# Patient Record
Sex: Female | Born: 1995 | Race: Black or African American | Hispanic: No | Marital: Single | Smoking: Never smoker
Health system: Southern US, Community
[De-identification: ages and names within clinical notes are randomized; demographics above are authoritative.]

## PROBLEM LIST (undated history)

## (undated) DIAGNOSIS — B54 Unspecified malaria: Secondary | ICD-10-CM

## (undated) DIAGNOSIS — R51 Headache: Secondary | ICD-10-CM

## (undated) DIAGNOSIS — G43909 Migraine, unspecified, not intractable, without status migrainosus: Secondary | ICD-10-CM

## (undated) DIAGNOSIS — R519 Headache, unspecified: Secondary | ICD-10-CM

## (undated) HISTORY — DX: Migraine, unspecified, not intractable, without status migrainosus: G43.909

## (undated) HISTORY — DX: Headache, unspecified: R51.9

## (undated) HISTORY — DX: Unspecified malaria: B54

## (undated) HISTORY — DX: Headache: R51

## (undated) HISTORY — PX: WISDOM TOOTH EXTRACTION: SHX21

---
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
## 2018-05-18 ENCOUNTER — Other Ambulatory Visit: Payer: Self-pay

## 2018-05-18 ENCOUNTER — Encounter (HOSPITAL_COMMUNITY): Payer: Self-pay

## 2018-05-18 ENCOUNTER — Ambulatory Visit (HOSPITAL_COMMUNITY)
Admission: EM | Admit: 2018-05-18 | Discharge: 2018-05-18 | Disposition: A | Discharge status: Home / Self Care | Payer: PRIVATE HEALTH INSURANCE | Attending: Family Medicine | Admitting: Family Medicine

## 2018-05-18 DIAGNOSIS — R51 Headache: Secondary | ICD-10-CM

## 2018-05-18 DIAGNOSIS — R519 Headache, unspecified: Secondary | ICD-10-CM

## 2018-05-18 MED ORDER — KETOROLAC TROMETHAMINE 60 MG/2ML IM SOLN
60.0000 mg | Freq: Once | INTRAMUSCULAR | Status: AC
  Administered 2018-05-18: 60 mg via INTRAMUSCULAR

## 2018-05-18 MED ORDER — DEXAMETHASONE SODIUM PHOSPHATE 10 MG/ML IJ SOLN
INTRAMUSCULAR | Status: AC
  Filled 2018-05-18: qty 1

## 2018-05-18 MED ORDER — NAPROXEN 500 MG PO TABS: 500.0000 mg | ORAL_TABLET | Freq: Two times a day (BID) | ORAL | 0 refills | Status: DC

## 2018-05-18 MED ORDER — KETOROLAC TROMETHAMINE 60 MG/2ML IM SOLN
INTRAMUSCULAR | Status: AC
  Filled 2018-05-18: qty 2

## 2018-05-18 MED ORDER — DEXAMETHASONE SODIUM PHOSPHATE 10 MG/ML IJ SOLN
10.0000 mg | Freq: Once | INTRAMUSCULAR | Status: AC
  Administered 2018-05-18: 16:00:00 10 mg via INTRAMUSCULAR

## 2018-05-18 NOTE — ED Provider Notes (Signed)
[facility]   [id number] 05/18/18 Arrival Time: 1459  [id number]  SUBJECTIVE:  Anne Romero is a 23 y.o. female who complains of HA x 6 days.  Denies a precipitating event, or recent head trauma.  Patient localizes her pain to the back of her head.  Describes the pain as constant.  Patient has tried multiple OTC medications without relief. Reports photosensitivity.  Reports similar symptoms in the past that improved with medications.  This is not the worst headache of her life.  Does admit to mild dizziness associated with HA, that is improving.  Patient denies fever, chills, nausea, vomiting, aura, rhinorrhea, watery eyes, chest pain, SOB, abdominal pain, weakness, numbness or tingling.    ROS: As per HPI.  History reviewed. No pertinent past medical history. History reviewed. No pertinent surgical history. No Known Allergies No current facility-administered medications on file prior to encounter.    No current outpatient medications on file prior to encounter.   Social History   Socioeconomic History  . Marital status: Single    Spouse name: Not on file  . Number of children: Not on file  . Years of education: Not on file  . Highest education level: Not on file  Occupational History  . Not on file  Social Needs  . Financial resource strain: Not on file  . Food insecurity:    Worry: Not on file    Inability: Not on file  . Transportation needs:    Medical: Not on file    Non-medical: Not on file  Tobacco Use  . Smoking status: Never Smoker  Substance and Sexual Activity  . Alcohol use: Not on file  . Drug use: Not on file  . Sexual activity: Not on file  Lifestyle  . Physical activity:    Days per week: Not on file    Minutes per session: Not on file  . Stress: Not on file  Relationships  . Social connections:    Talks on phone: Not on file    Gets together: Not on file    Attends religious service: Not on file    Active member of club or  organization: Not on file    Attends meetings of clubs or organizations: Not on file    Relationship status: Not on file  . Intimate partner violence:    Fear of current or ex partner: Not on file    Emotionally abused: Not on file    Physically abused: Not on file    Forced sexual activity: Not on file  Other Topics Concern  . Not on file  Social History Narrative  . Not on file   Family History  Family history unknown: Yes    OBJECTIVE:  Vitals:   05/18/18 1517  BP: 119/74  Pulse: 94  Resp: 17  Temp: 98.1 F (36.7 C)  TempSrc: Oral  SpO2: 97%    General appearance: alert; no distress Eyes: PERRLA; EOMI HENT: normocephalic; atraumatic; mildly TTP over occipital lobe Neck: supple with FROM Lungs: clear to auscultation bilaterally Heart: regular rate and rhythm.  Radial pulses 2+ symmetrical bilaterally Extremities: no edema; symmetrical with no gross deformities Skin: warm and dry Neurologic: CN 2-12 grossly intact; finger to nose without difficulty; normal gait; strength and sensation intact bilaterally about the upper and lower extremities; negative pronator drift Psychological: alert and cooperative; normal mood and affect  ASSESSMENT & PLAN:  1. Acute nonintractable headache, unspecified headache type     Meds ordered this encounter  Medications  . ketorolac (TORADOL) injection 60 mg  . dexamethasone (DECADRON) injection 10 mg  . naproxen (NAPROSYN) 500 MG tablet    Sig: Take 1 tablet (500 mg total) by mouth 2 (two) times daily.    Dispense:  30 tablet    Refill:  0    Order Specific Question:   Supervising Provider    Answer:   Eustace MooreELSON, YVONNE SUE [[id number]]   Toradol shot given in office Decadron shot given in office Rest and drink plenty of fluids Naproxen prescription attached.  Fill as needed if symptoms persists.  DO NOT TAKE with other antiinflammatories as this may cause GI upset or bleed Follow up with PCP if symptoms persists Return or go to  the ER if you have any new or worsening symptoms such as fever, chills, nausea, vomiting, chest pain, shortness of breath, abdominal pain, weakness, numbness or tingling, etc...   Reviewed expectations re: course of current medical issues. Questions answered. Outlined signs and symptoms indicating need for more acute intervention. Patient verbalized understanding. After Visit Summary given.   Rennis HardingWurst, Teona Vargus, PA-C 05/18/18 1615

## 2018-05-18 NOTE — ED Triage Notes (Signed)
Pt presents with headache and dizziness for about a week.

## 2018-05-18 NOTE — Discharge Instructions (Signed)
Toradol shot given in office Decadron shot given in office Rest and drink plenty of fluids Naproxen prescription attached.  Fill as needed if symptoms persists.  DO NOT TAKE with other antiinflammatories as this may cause GI upset or bleed Follow up with PCP if symptoms persists Return or go to the ER if you have any new or worsening symptoms such as fever, chills, nausea, vomiting, chest pain, shortness of breath, abdominal pain, weakness, numbness or tingling, etc..Marland Kitchen

## 2018-05-20 ENCOUNTER — Emergency Department (HOSPITAL_COMMUNITY)
Admission: EM | Admit: 2018-05-20 | Discharge: 2018-05-20 | Disposition: A | Discharge status: Home / Self Care | Payer: 59 | Attending: Emergency Medicine | Admitting: Emergency Medicine

## 2018-05-20 ENCOUNTER — Other Ambulatory Visit: Payer: Self-pay

## 2018-05-20 ENCOUNTER — Encounter (HOSPITAL_COMMUNITY): Payer: Self-pay

## 2018-05-20 DIAGNOSIS — R51 Headache: Secondary | ICD-10-CM | POA: Insufficient documentation

## 2018-05-20 DIAGNOSIS — R519 Headache, unspecified: Secondary | ICD-10-CM

## 2018-05-20 MED ORDER — PROCHLORPERAZINE EDISYLATE 10 MG/2ML IJ SOLN
10.0000 mg | Freq: Once | INTRAMUSCULAR | Status: AC
  Administered 2018-05-20: 14:00:00 10 mg via INTRAMUSCULAR
  Filled 2018-05-20: qty 2

## 2018-05-20 MED ORDER — DIPHENHYDRAMINE HCL 50 MG/ML IJ SOLN
25.0000 mg | Freq: Once | INTRAMUSCULAR | Status: AC
  Administered 2018-05-20: 25 mg via INTRAMUSCULAR
  Filled 2018-05-20: qty 1

## 2018-05-20 MED ORDER — DEXAMETHASONE 4 MG PO TABS
10.0000 mg | ORAL_TABLET | Freq: Once | ORAL | Status: AC
  Administered 2018-05-20: 10 mg via ORAL
  Filled 2018-05-20: qty 3

## 2018-05-20 NOTE — Discharge Instructions (Signed)
Return for fever, worsening pain, one sided weakness or numbness, difficulty with speech or swallowing

## 2018-05-20 NOTE — ED Triage Notes (Signed)
Pt reports she has been experiencing frequent headaches X1 week. Pt ambulatory. Seen earlier this week at [facility] for same.

## 2018-05-20 NOTE — ED Provider Notes (Signed)
[facility]   CSN: [id number] Arrival date & time: 05/20/18  1322    History   Chief Complaint Chief Complaint  Patient presents with  . Headache    HPI Anne Romero is a 23 y.o. female.     23 yo F with a chief complaint of an occipital headache.  Been going on for about 3 or 4 days. This is much better after she went to urgent care and got 2 injections but she feels a sensation like she is having leakage of fluid from the back of her head into the right side of her neck.  She also feels her back of her head has a funny smell to it.  This is made her have trouble sleeping.  Typically wakes up and can smell sent again.  Denies any intraoral pain denies ear pain denies difficulty with speech or swallowing denies unilateral numbness or weakness denies neck pain.  The history is provided by the patient.  Headache  Pain location:  Occipital Quality:  Dull Radiates to:  Does not radiate Severity currently:  0/10 Severity at highest:  0/10 Onset quality:  Gradual Timing:  Constant Progression:  Worsening Chronicity:  New Similar to prior headaches: no   Relieved by:  Nothing Worsened by:  Nothing Ineffective treatments:  None tried Associated symptoms: no abdominal pain, no congestion, no dizziness, no fever, no myalgias, no nausea and no vomiting     History reviewed. No pertinent past medical history.  There are no active problems to display for this patient.   History reviewed. No pertinent surgical history.   OB History   No obstetric history on file.      Home Medications    Prior to Admission medications   Medication Sig Start Date End Date Taking? Authorizing Provider  naproxen (NAPROSYN) 500 MG tablet Take 1 tablet (500 mg total) by mouth 2 (two) times daily. 05/18/18   Rennis HardingWurst, Brittany, PA-C    Family History Family History  Family history unknown: Yes    Social History Social History    Tobacco Use  . Smoking status: Never Smoker  . Smokeless tobacco: Never Used  Substance Use Topics  . Alcohol use: Not on file  . Drug use: Not on file     Allergies   Patient has no known allergies.   Review of Systems Review of Systems  Constitutional: Negative for chills and fever.  HENT: Negative for congestion and rhinorrhea.   Eyes: Negative for redness and visual disturbance.  Respiratory: Negative for shortness of breath and wheezing.   Cardiovascular: Negative for chest pain and palpitations.  Gastrointestinal: Negative for abdominal pain, nausea and vomiting.  Genitourinary: Negative for dysuria and urgency.  Musculoskeletal: Negative for arthralgias and myalgias.  Skin: Negative for pallor and wound.  Neurological: Positive for headaches. Negative for dizziness.     Physical Exam Updated Vital Signs BP 130/80 (BP Location: Right Arm)   Pulse 87   Temp 97.8 F (36.6 C) (Oral)   Resp 16   LMP 05/10/2018   SpO2 99%   Physical Exam Vitals signs and nursing note reviewed.  Constitutional:      General: She is not in acute distress.    Appearance: She is well-developed. She is not diaphoretic.  HENT:     Head: Normocephalic and atraumatic.     Comments: Patient has multiple braids.  There is no appreciable erythema drainage or fluctuance to the occiput.  Palpated without  any significant tenderness.  Right mastoid process is no erythema or tenderness.  Right TM is normal.  No appreciable intraoral pathology.  Posterior molars are percussed without pain.  She has no sublingual swelling she is able to tolerate secretions without difficulty. Eyes:     Pupils: Pupils are equal, round, and reactive to light.  Neck:     Musculoskeletal: Normal range of motion and neck supple.  Cardiovascular:     Rate and Rhythm: Normal rate and regular rhythm.     Heart sounds: No murmur. No friction rub. No gallop.   Pulmonary:     Effort: Pulmonary effort is normal.     Breath  sounds: No wheezing or rales.  Abdominal:     General: There is no distension.     Palpations: Abdomen is soft.     Tenderness: There is no abdominal tenderness.  Musculoskeletal:        General: No tenderness.  Skin:    General: Skin is warm and dry.  Neurological:     Mental Status: She is alert and oriented to person, place, and time.     Cranial Nerves: Cranial nerves are intact.     Motor: Motor function is intact.     Coordination: Coordination normal.     Gait: Gait is intact.  Psychiatric:        Behavior: Behavior normal.      ED Treatments / Results  Labs (all labs ordered are listed, but only abnormal results are displayed) Labs Reviewed - No data to display  EKG None  Radiology No results found.  Procedures Procedures (including critical care time)  Medications Ordered in ED Medications  prochlorperazine (COMPAZINE) injection 10 mg (has no administration in time range)  diphenhydrAMINE (BENADRYL) injection 25 mg (has no administration in time range)  dexamethasone (DECADRON) tablet 10 mg (has no administration in time range)     Initial Impression / Assessment and Plan / ED Course  I have reviewed the triage vital signs and the nursing notes.  Pertinent labs & imaging results that were available during my care of the patient were reviewed by me and considered in my medical decision making (see chart for details).        23 yo F with a chief complaint of a feeling that something is draining on the right side of her head and that it is foul-smelling.  Based on that history expected to find an abscess with active drainage but I was unable to identify something on physical exam.  Right TM is normal.  Her intraoral exam is normal.  She has no obvious neurologic deficits.  This could be an atypical migraine with a sensory component.  We will give her a headache cocktail have her follow-up with the neurologist in the office.  1:53 PM:  I have discussed the  diagnosis/risks/treatment options with the patient and believe the pt to be eligible for discharge home to follow-up with PCP, neuro. We also discussed returning to the ED immediately if new or worsening sx occur. We discussed the sx which are most concerning (e.g., sudden worsening pain, fever, inability to tolerate by mouth) that necessitate immediate return. Medications administered to the patient during their visit and any new prescriptions provided to the patient are listed below.  Medications given during this visit Medications  prochlorperazine (COMPAZINE) injection 10 mg (has no administration in time range)  diphenhydrAMINE (BENADRYL) injection 25 mg (has no administration in time range)  dexamethasone (DECADRON) tablet 10  mg (has no administration in time range)     The patient appears reasonably screen and/or stabilized for discharge and I doubt any other medical condition or other [facility] requiring further screening, evaluation, or treatment in the ED at this time prior to discharge.    Final Clinical Impressions(s) / ED Diagnoses   Final diagnoses:  Occipital headache    ED Discharge Orders         Ordered    Ambulatory referral to Neurology    Comments:  New headache syndrome?   05/20/18 1348           Melene Plan, DO 05/20/18 1353

## 2018-05-20 NOTE — ED Notes (Signed)
Patient verbalizes understanding of discharge instructions. Opportunity for questioning and answers were provided. Armband removed by staff, pt discharged from ED.  

## 2018-05-30 NOTE — Progress Notes (Addendum)
Virtual Visit via Video Note The purpose of this virtual visit is to provide medical care while limiting exposure to the novel coronavirus.    Consent was obtained for video visit:  Yes.   Answered questions that patient had about telehealth interaction:  Yes.   I discussed the limitations, risks, security and privacy concerns of performing an evaluation and management service by telemedicine. I also discussed with the patient that there may be a patient responsible charge related to this service. The patient expressed understanding and agreed to proceed.  Pt location: Home Physician Location: office Name of referring provider:  Melene Plan, DO I connected with Anne Romero at patients initiation/request on 05/31/2018 at 12:50 PM EDT by video enabled telemedicine application and verified that I am speaking with the correct person using two identifiers. Pt MRN:  [id number] Pt DOB:  [birth year] Video Participants:  Anne Romero   History of Present Illness:  Anne Romero is a 23 year old woman who presents for headache.  History supplemented by Urgent Care and ED notes.   She has a history of headaches since teenage years.  Occasionally she would wake up with a mild headache which would last a few minutes after taking Aleve.  They occur about 3 times a month.     She presented to [facility] on 05/18/18 for 6 days of headache.  This headache was different.  She woke up with a headache and it had been persistent.  No preceding head trauma, fever, illness or other precipitating factor.  She described it as an 8/10 posterior pressure-like pain.  It was not the worst headache of her life and reports similar headaches in the past.  It was associated with some blurred vision and lightheadedness (particularly when she would stand up).  With lightheadedness, she experienced some nausea but no vomiting.  It was not associated with photophobia, phonophobia or unilateral  numbness or weakness.  They would not respond to Aleve.  She was treated Toradol and Decadron injections.  She returned [facility] ED two days later because headache persisted and she had a sensation of fluid leaking from the back of her head and down the right side of her neck.  She also endorsed lack of smell (such as inability to small perfume) and reported smelling gasoline that wasn't there.  Physical exam did not reveal any tenderness, erythema or abscess involving the suboccipital region, mastoid region or TM.  She was again treated with another headache cocktail of Compazine, Benadryl and Decadron injections and discharged with neurology follow up. Headache resolved about a week ago.  She still reports decreased sense of smell.  At night, she sometimes still can small gasoline.  Past Medical History: No past medical history.  Medications: Outpatient Encounter Medications as of 05/31/2018  Medication Sig  . naproxen (NAPROSYN) 500 MG tablet Take 1 tablet (500 mg total) by mouth 2 (two) times daily.   No facility-administered encounter medications on file as of 05/31/2018.     Allergies: No Known Allergies  Family History: Family History  Family history unknown: Yes    Social History: Social History   Socioeconomic History  . Marital status: Single    Spouse name: Not on file  . Number of children: Not on file  . Years of education: Not on file  . Highest education level: Not on file  Occupational History  . Not on file  Social Needs  . Financial resource strain: Not on file  .  Food insecurity:    Worry: Not on file    Inability: Not on file  . Transportation needs:    Medical: Not on file    Non-medical: Not on file  Tobacco Use  . Smoking status: Never Smoker  . Smokeless tobacco: Never Used  Substance and Sexual Activity  . Alcohol use: Not on file  . Drug use: Not on file  . Sexual activity: Not on file  Lifestyle  . Physical activity:    Days per week:  Not on file    Minutes per session: Not on file  . Stress: Not on file  Relationships  . Social connections:    Talks on phone: Not on file    Gets together: Not on file    Attends religious service: Not on file    Active member of club or organization: Not on file    Attends meetings of clubs or organizations: Not on file    Relationship status: Not on file  . Intimate partner violence:    Fear of current or ex partner: Not on file    Emotionally abused: Not on file    Physically abused: Not on file    Forced sexual activity: Not on file  Other Topics Concern  . Not on file  Social History Narrative  . Not on file    Review Of Systems: Review of Systems  Constitutional: Negative for chills, fever and malaise/fatigue.  HENT: Negative for congestion, ear discharge, ear pain, hearing loss, sore throat and tinnitus.   Eyes: Negative for blurred vision, double vision, photophobia, pain, discharge and redness.  Respiratory: Negative for cough, sputum production, shortness of breath and wheezing.   Cardiovascular: Negative for chest pain, palpitations and orthopnea.  Gastrointestinal: Negative for abdominal pain, constipation, diarrhea, nausea and vomiting.  Genitourinary: Negative for dysuria.  Musculoskeletal: Negative for myalgias.  Skin: Negative for itching and rash.  Neurological: Negative for tremors, sensory change, speech change and focal weakness.  Endo/Heme/Allergies: Does not bruise/bleed easily.  Psychiatric/Behavioral: Negative for depression. The patient is not nervous/anxious.    Observations/Objective:   Height 5\' 5"  (1.651 m), weight 198 lb (89.8 kg), last menstrual period 05/10/2018. Alert and oriented.  Speech fluent and not dysarthric.  Language intact.  Eyes orthophoric and move in all directions.  Facial sensation intact.  Face symmetric.  Tongue midline.  Pronator drift absent.  Finger-to-nose testing normal.  Romberg negative. Assessment and Plan:   1.  New  daily persistent headache 2.  Anosmia 3.  Phantosmia  Headache may have been related to seasonal allergies, especially since it has improved.  Anosmia may be related to allergies however she reports no congestion or upper respiratory symptoms.  It may also be a symptom of COVID-19, however this is unlikely given that she has not had fever, cough, shortness of breath.  It would also not account for the phantosmia.  Unclear of etiology of phantosmia.  May be feature of migraine.  Unlikely to be epileptic activity.  Given that this was a new intractable headache and she continues to experience anosmia and phantosmia, imaging of the brain is warranted.  1.  MRI of brain with and without contrast.  Further recommendations pending results.  Follow Up Instructions:    -I discussed the assessment and treatment plan with the patient. The patient was provided an opportunity to ask questions and all were answered. The patient agreed with the plan and demonstrated an understanding of the instructions.   The patient was  advised to call back or seek an in-person evaluation if the symptoms worsen or if the condition fails to improve as anticipated.  Dudley Major, DO

## 2018-05-31 ENCOUNTER — Encounter: Payer: Self-pay | Admitting: Neurology

## 2018-05-31 ENCOUNTER — Other Ambulatory Visit: Payer: Self-pay

## 2018-05-31 ENCOUNTER — Telehealth (INDEPENDENT_AMBULATORY_CARE_PROVIDER_SITE_OTHER): Payer: PRIVATE HEALTH INSURANCE | Admitting: Neurology

## 2018-05-31 VITALS — Ht 65.0 in | Wt 198.0 lb

## 2018-05-31 DIAGNOSIS — G4452 New daily persistent headache (NDPH): Secondary | ICD-10-CM | POA: Diagnosis not present

## 2018-05-31 DIAGNOSIS — R442 Other hallucinations: Secondary | ICD-10-CM

## 2018-05-31 DIAGNOSIS — R430 Anosmia: Secondary | ICD-10-CM

## 2018-06-01 NOTE — Addendum Note (Signed)
Addended by: Dorthy Cooler on: 06/01/2018 09:35 AM   Modules accepted: Orders

## 2018-06-15 ENCOUNTER — Ambulatory Visit
Admission: RE | Admit: 2018-06-15 | Discharge: 2018-06-15 | Disposition: A | Discharge status: Home / Self Care | Payer: PRIVATE HEALTH INSURANCE | Source: Ambulatory Visit | Attending: Neurology | Admitting: Neurology

## 2018-06-15 ENCOUNTER — Other Ambulatory Visit: Payer: Self-pay

## 2018-06-15 DIAGNOSIS — G4452 New daily persistent headache (NDPH): Secondary | ICD-10-CM

## 2018-06-15 DIAGNOSIS — R430 Anosmia: Secondary | ICD-10-CM

## 2018-06-15 DIAGNOSIS — R442 Other hallucinations: Secondary | ICD-10-CM

## 2018-06-15 MED ORDER — GADOBENATE DIMEGLUMINE 529 MG/ML IV SOLN
19.0000 mL | Freq: Once | INTRAVENOUS | Status: AC | PRN
  Administered 2018-06-15: 19 mL via INTRAVENOUS

## 2018-06-20 ENCOUNTER — Telehealth: Payer: Self-pay

## 2018-06-20 DIAGNOSIS — R442 Other hallucinations: Secondary | ICD-10-CM

## 2018-06-20 NOTE — Telephone Encounter (Signed)
-----   Message from Drema Dallas, DO sent at 06/15/2018 10:21 AM EDT ----- MRI of brain is normal.  I would like to order EEG for phantosmia

## 2018-06-20 NOTE — Telephone Encounter (Signed)
Called Pt, LMOVM for her to rtrn call re: MRI results

## 2018-06-23 NOTE — Addendum Note (Signed)
Addended by: Dorthy Cooler on: 06/23/2018 03:56 PM   Modules accepted: Orders

## 2018-06-23 NOTE — Telephone Encounter (Signed)
Called and advised Pt of MRI results and EEG recommendation. She is aware she will receive a phone call to schedule the EEG.

## 2018-07-06 ENCOUNTER — Other Ambulatory Visit: Payer: 59

## 2018-07-11 ENCOUNTER — Ambulatory Visit (INDEPENDENT_AMBULATORY_CARE_PROVIDER_SITE_OTHER): Payer: 59 | Admitting: Neurology

## 2018-07-11 ENCOUNTER — Other Ambulatory Visit: Payer: Self-pay

## 2018-07-11 DIAGNOSIS — R442 Other hallucinations: Secondary | ICD-10-CM | POA: Diagnosis not present

## 2018-07-14 ENCOUNTER — Telehealth: Payer: Self-pay

## 2018-07-14 NOTE — Telephone Encounter (Signed)
Called and advised Pt of EEG results 

## 2018-07-14 NOTE — Telephone Encounter (Signed)
-----   Message from Pieter Partridge, DO sent at 07/14/2018 12:05 PM EDT ----- Please let patient know that EEG is normal

## 2018-07-15 NOTE — Procedures (Signed)
ELECTROENCEPHALOGRAM REPORT  Date of Study: 07/11/2018  Patient's Name: Anne Romero MRN: [id number] Date of Birth: 1995-11-23  Clinical History: phantosmia and migraines  Medications: naproxen  Technical Summary: A multichannel digital EEG recording measured by the international 10-20 system with electrodes applied with paste and impedances below 5000 ohms performed in our laboratory with EKG monitoring in an awake and asleep patient.  Hyperventilation not performed as patient wearing mask for COVID-19.  Photic stimulation was performed.  The digital EEG was referentially recorded, reformatted, and digitally filtered in a variety of bipolar and referential montages for optimal display.    Description: The patient is awake and asleep during the recording.  During maximal wakefulness, there is a symmetric, medium voltage 9 Hz posterior dominant rhythm that attenuates with eye opening.  The record is symmetric.  During drowsiness and sleep, there is an increase in theta slowing of the background.  Vertex waves and symmetric sleep spindles were seen.  Photic stimulation did not elicit any abnormalities.  There were no epileptiform discharges or electrographic seizures seen.    EKG lead was unremarkable.  Impression: This awake and asleep EEG is normal.    Clinical Correlation: A normal EEG does not exclude a clinical diagnosis of epilepsy.  If further clinical questions remain, prolonged EEG may be helpful.  Clinical correlation is advised.   Metta Clines, DO

## 2019-05-21 ENCOUNTER — Encounter (HOSPITAL_COMMUNITY): Payer: Self-pay

## 2019-05-21 ENCOUNTER — Other Ambulatory Visit: Payer: Self-pay

## 2019-05-21 ENCOUNTER — Ambulatory Visit (HOSPITAL_COMMUNITY)
Admission: EM | Admit: 2019-05-21 | Discharge: 2019-05-21 | Disposition: A | Discharge status: Home / Self Care | Payer: PRIVATE HEALTH INSURANCE | Attending: Family Medicine | Admitting: Family Medicine

## 2019-05-21 DIAGNOSIS — H65191 Other acute nonsuppurative otitis media, right ear: Secondary | ICD-10-CM

## 2019-05-21 DIAGNOSIS — H9201 Otalgia, right ear: Secondary | ICD-10-CM | POA: Diagnosis not present

## 2019-05-21 MED ORDER — SIMETHICONE 80 MG PO CHEW: 80.0000 mg | CHEWABLE_TABLET | Freq: Four times a day (QID) | ORAL | 0 refills | Status: DC | PRN

## 2019-05-21 NOTE — ED Triage Notes (Signed)
Pt c/o fb in right ear. Pt reports that while cleaning ears with cotton swab, the cotton got stuck in right ear.

## 2019-05-21 NOTE — Discharge Instructions (Addendum)
You may take Zyrtec to help dry up the fluid in your ears.   If you are not feeling better in the  next 2 days, follow up with our office or primary care as needed.

## 2019-05-21 NOTE — ED Provider Notes (Signed)
[place]    CSN: [id number] Arrival date & time: 05/21/19  1035      History   Chief Complaint Chief Complaint  Patient presents with  . Foreign Body in [place] is a 24 y.o. female.   Patient reports that she thinks she has a foreign body in her right ear.  Reports that she was cleaning her ears last night with Q-tips, and she pulled the Q-tip out of her ear and there was no cotton on the end.  Reports that she did not see it when she woke up in the bed this morning.  Describes fullness of her right ear and mild discomfort.  Denies headache, sore throat, cough, nausea, vomiting, diarrhea, rash, fever, other symptoms.  ROS per HPI  The history is provided by the patient.    Past Medical History:  Diagnosis Date  . Frequent headaches     There are no problems to display for this patient.   Past Surgical History:  Procedure Laterality Date  . WISDOM TOOTH EXTRACTION      OB History   No obstetric history on file.      Home Medications    Prior to Admission medications   Medication Sig Start Date End Date Taking? Authorizing Provider  naproxen (NAPROSYN) 500 MG tablet Take 1 tablet (500 mg total) by mouth 2 (two) times daily. 05/18/18   Wurst, [place], PA-C  simethicone (GAS-X) 80 MG chewable tablet Chew 1 tablet (80 mg total) by mouth every 6 (six) hours as needed for flatulence. 05/21/19   Faustino Congress, NP    Family History Family History  Problem Relation Age of Onset  . Hypertension Mother     Social History Social History   Tobacco Use  . Smoking status: Never Smoker  . Smokeless tobacco: Never Used  Substance Use Topics  . Alcohol use: Never  . Drug use: Never     Allergies   Patient has no known allergies.   Review of Systems Review of Systems   Physical Exam Triage Vital Signs ED Triage Vitals  Enc Vitals Group     BP 05/21/19 1128 140/82     Pulse Rate 05/21/19 1128 72     Resp  05/21/19 1128 18     Temp 05/21/19 1128 97.8 F (36.6 C)     Temp Source 05/21/19 1128 Oral     SpO2 05/21/19 1128 100 %     Weight --      Height --      Head Circumference --      Peak Flow --      Pain Score 05/21/19 1125 1     Pain Loc --      Pain Edu? --      Excl. in [place]? --    No data found.  Updated Vital Signs BP 140/82 (BP Location: Right Arm)   Pulse 72   Temp 97.8 F (36.6 C) (Oral)   Resp 18   LMP 05/06/2019   SpO2 100%   Visual Acuity Right Eye Distance:   Left Eye Distance:   Bilateral Distance:    Right Eye Near:   Left Eye Near:    Bilateral Near:     Physical Exam Vitals and nursing note reviewed.  Constitutional:      General: She is not in acute distress.    Appearance: Normal appearance. She is well-developed and normal weight. She is not ill-appearing.  HENT:  Head: Normocephalic and atraumatic.     Right Ear: A middle ear effusion is present.     Left Ear: Tympanic membrane normal.     Nose: Nose normal. No congestion.     Mouth/Throat:     Mouth: Mucous membranes are moist.     Pharynx: Oropharynx is clear. No posterior oropharyngeal erythema.  Eyes:     Extraocular Movements: Extraocular movements intact.     Conjunctiva/sclera: Conjunctivae normal.     Pupils: Pupils are equal, round, and reactive to light.  Cardiovascular:     Rate and Rhythm: Normal rate and regular rhythm.     Heart sounds: Normal heart sounds. No murmur.  Pulmonary:     Effort: Pulmonary effort is normal. No respiratory distress.     Breath sounds: Normal breath sounds.  Abdominal:     Palpations: Abdomen is soft.     Tenderness: There is no abdominal tenderness.  Musculoskeletal:        General: Normal range of motion.     Cervical back: Normal range of motion and neck supple. No rigidity.  Lymphadenopathy:     Cervical: No cervical adenopathy.  Skin:    General: Skin is warm and dry.  Neurological:     General: No focal deficit present.     Mental  Status: She is alert and oriented to person, place, and time.  Psychiatric:        Mood and Affect: Mood normal.        Behavior: Behavior normal.        Thought Content: Thought content normal.      UC Treatments / Results  Labs (all labs ordered are listed, but only abnormal results are displayed) Labs Reviewed - No data to display  EKG   Radiology No results found.  Procedures Procedures (including critical care time)  Medications Ordered in UC Medications - No data to display  Initial Impression / Assessment and Plan / UC Course  I have reviewed the triage vital signs and the nursing notes.  Pertinent labs & imaging results that were available during my care of the patient were reviewed by me and considered in my medical decision making (see chart for details).     Otalgia: Right otalgia since yesterday.  Right TM with middle ear effusion, no bulging no erythema.  Left TM pearly gray, no effusion.  No foreign body noted in either ear.  Patient instructed that she may take daily over-the-counter antihistamine to help dry up with fluid..  Patient to follow-up with primary care with this office as needed.  Patient verbalized understanding is in agreement with treatment plan. Final Clinical Impressions(s) / UC Diagnoses   Final diagnoses:  Otalgia of right ear     Discharge Instructions     You may take Zyrtec to help dry up the fluid in your ears.   If you are not feeling better in the  next 2 days, follow up with our office or primary care as needed.     ED Prescriptions    Medication Sig Dispense Auth. Provider   simethicone (GAS-X) 80 MG chewable tablet Chew 1 tablet (80 mg total) by mouth every 6 (six) hours as needed for flatulence. 30 tablet Moshe Cipro, NP     PDMP not reviewed this encounter.   Moshe Cipro, NP 05/22/19 2134

## 2020-12-28 IMAGING — MR MRI HEAD WITHOUT AND WITH CONTRAST
13 series · 45 of 48 positions shown · IV contrast (multihance)
Comparison: None.

CLINICAL DATA: Longstanding headaches. This is worse for the past
month. Unpleasant olfactory auras.

EXAM:
MRI HEAD WITHOUT AND WITH CONTRAST
TECHNIQUE: Multiplanar, multiecho pulse sequences of the brain and surrounding
structures were obtained without and with intravenous contrast.
CONTRAST:  19mL MULTIHANCE GADOBENATE DIMEGLUMINE 529 MG/ML IV SOLN

[Series 2: t1_se_sag · sagittal · 5.0mm · 0.45mm/px · 1 of 21 slices shown]
[im 1/21]
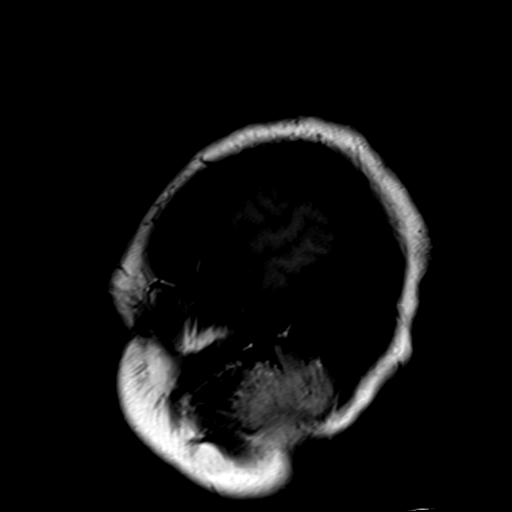

[Series 3: ep2d_diff_(id)_trace · axial · 3.0mm · 1.80mm/px · z∈[-51,+88]mm · 6 of 93 slices shown]
[im 1/93]
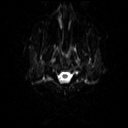
[im 19/93]
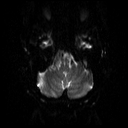
[im 37/93]
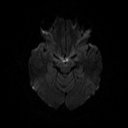
[im 56/93]
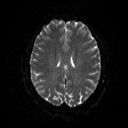
[im 74/93]
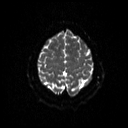
[im 93/93]
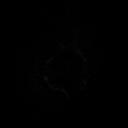

[Series 4: ep2d_diff_(id)_trace_adc · axial · 3.0mm · 1.80mm/px · z∈[-51,+88]mm · 3 of 48 slices shown]
[im 1/48]
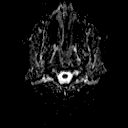
[im 24/48]
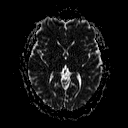
[im 48/48]
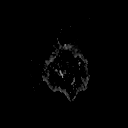

[Series 5: ep2d_diff_cor · coronal · 5.0mm · 1.77mm/px · 3 of 47 slices shown]
[im 1/47]
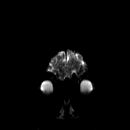
[im 24/47]
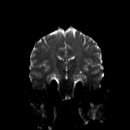
[im 47/47]
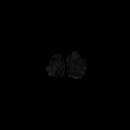

[Series 6: ep2d_diff_cor_adc · coronal · 5.0mm · 1.77mm/px · 2 of 24 slices shown]
[im 1/24]
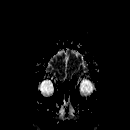
[im 24/24]
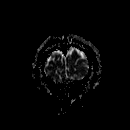

[Series 7: t2_tse_tra · axial · 5.0mm · 0.72mm/px · z∈[-66,+81]mm · 2 of 26 slices shown]
[im 1/26]
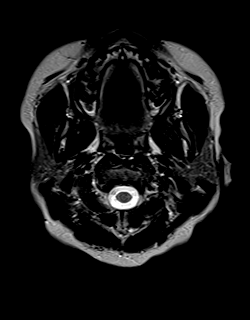
[im 26/26]
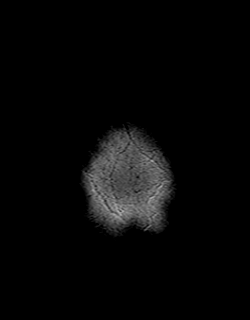

[Series 8: FLAIR · axial · 3.0mm · 0.43mm/px · z∈[-69,+84]mm · 2 of 27 slices shown]
[im 1/27]
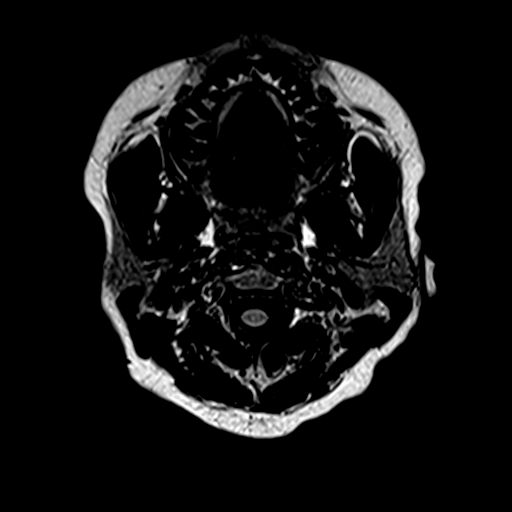
[im 27/27]
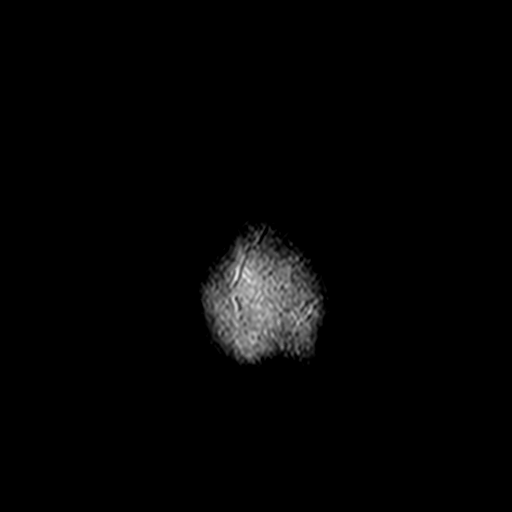

[Series 10: swi_images · axial · 4.0mm · 0.90mm/px · z∈[-61,+76]mm · 2 of 36 slices shown]
[im 1/36]
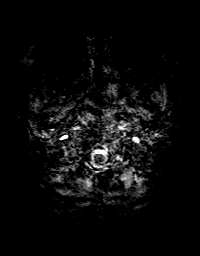
[im 36/36]
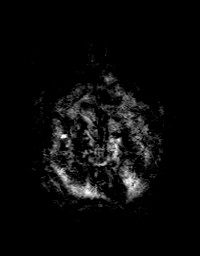

[Series 11: t1_mpr_tra · axial · 1.0mm · 0.72mm/px · z∈[-63,+77]mm · 8 of 144 slices shown]
[im 1/144]
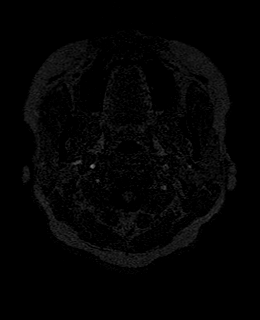
[im 18/144]
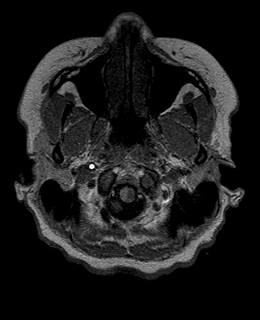
[im 36/144]
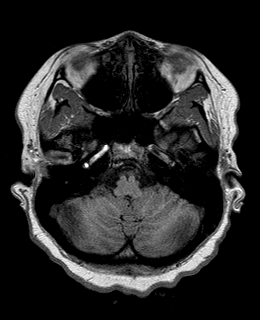
[im 54/144]
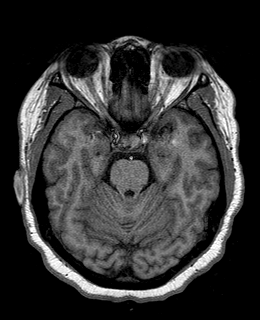
[im 90/144]
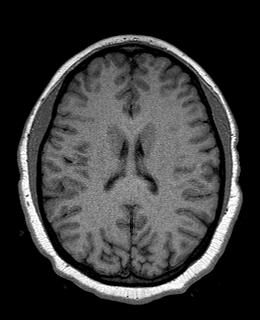
[im 108/144]
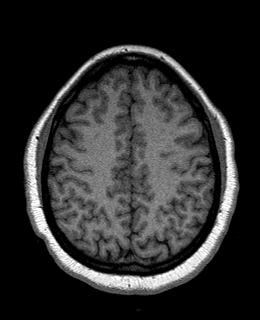
[im 126/144]
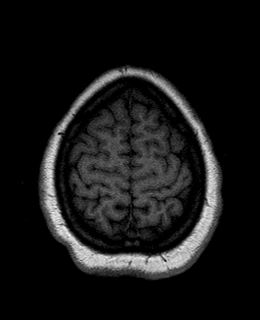
[im 144/144]
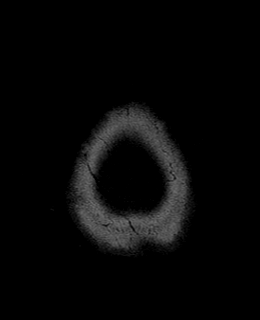

[Series 12: bSSFP · axial · 0.7mm · 0.28mm/px · z∈[-28,+21]mm · 5 of 72 slices shown]
[im 1/72]
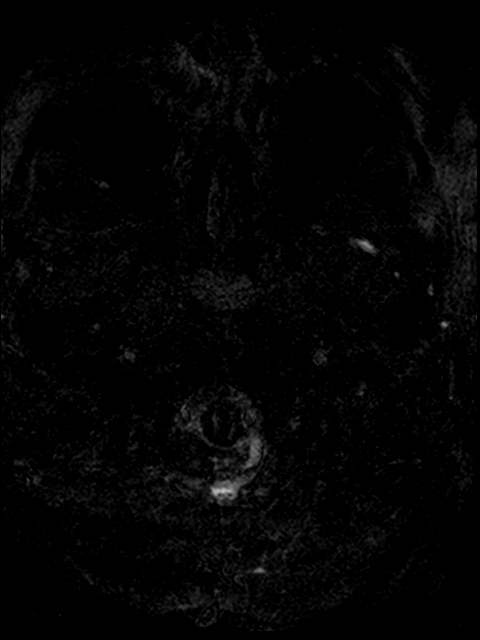
[im 18/72]
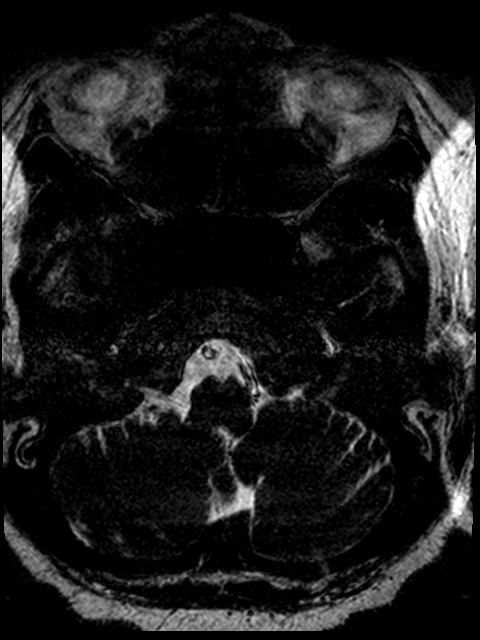
[im 36/72]
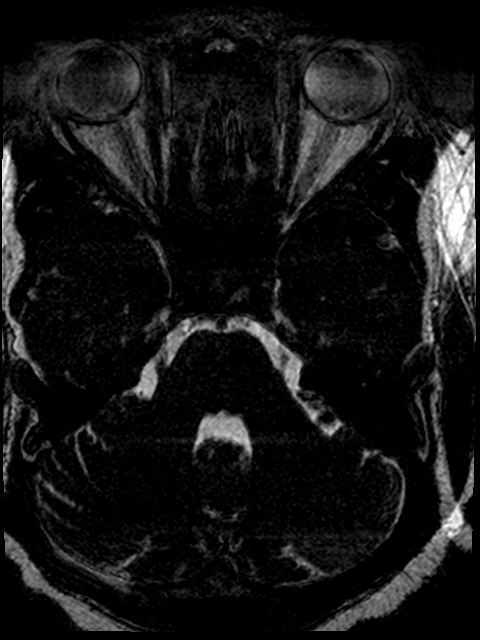
[im 54/72]
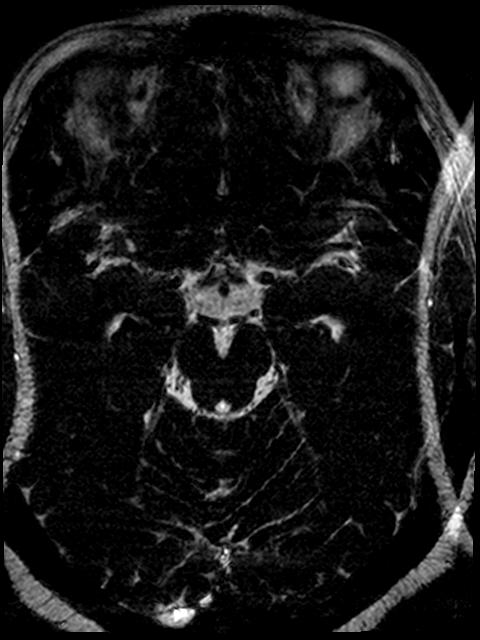
[im 72/72]
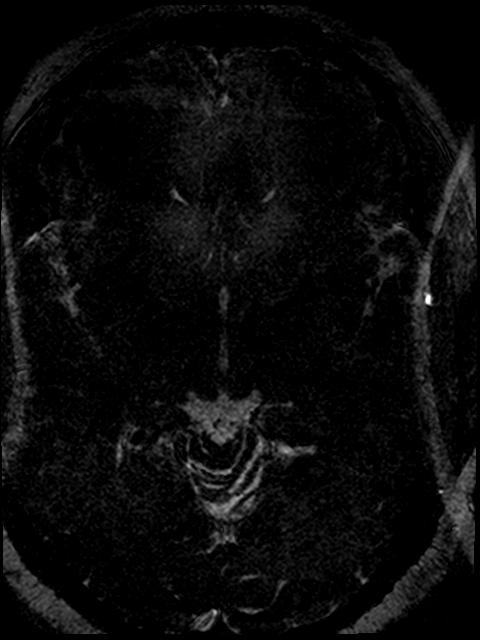

[Series 14: post t1_mpr_tra · axial · 1.0mm · 0.72mm/px · z∈[-63,+60]mm · 7 of 144 slices shown]
[im 1/144]
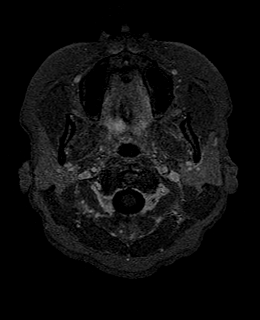
[im 18/144]
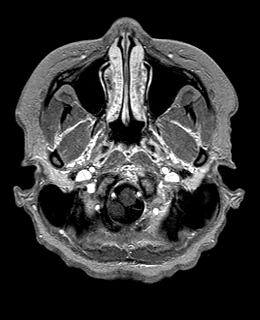
[im 36/144]
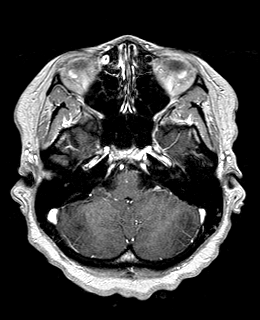
[im 54/144]
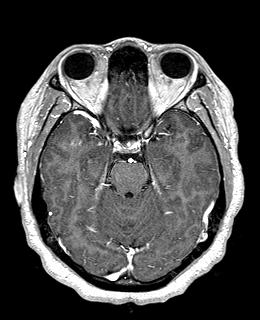
[im 90/144]
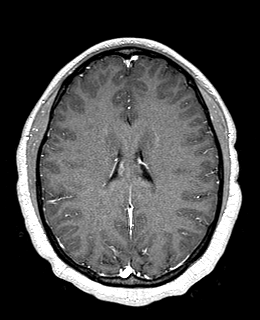
[im 108/144]
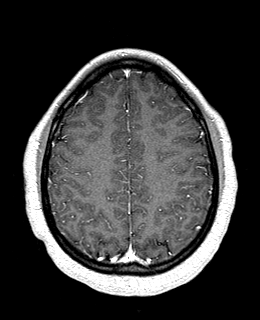
[im 126/144]
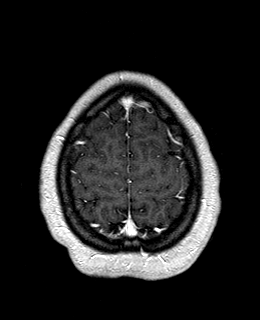

[Series 15: T1 post-contrast · coronal · 5.0mm · 0.72mm/px · 2 of 30 slices shown]
[im 1/30]
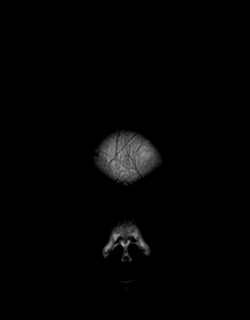
[im 30/30]
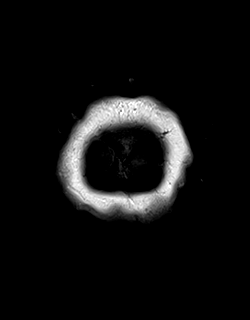

[Series 16: T2 · coronal · 5.0mm · 0.45mm/px · 2 of 26 slices shown]
[im 1/26]
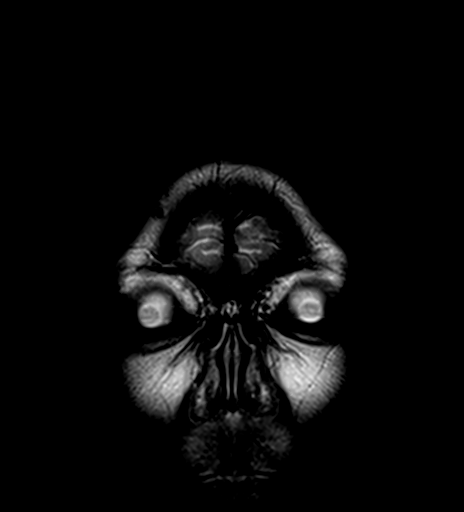
[im 26/26]
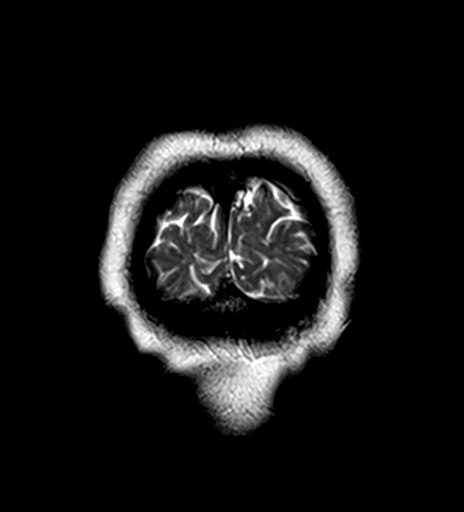

[45 of 48 positions shown; findings below may reference images not displayed]

FINDINGS: Brain: No evidence for acute infarction, hemorrhage, mass lesion,
hydrocephalus, or extra-axial fluid. Normal cerebral volume. No
white matter disease.

Post infusion, no abnormal enhancement of the brain or meninges.

Vascular: Flow voids are maintained throughout the carotid, basilar,
and vertebral arteries. There are no areas of chronic hemorrhage.

Skull and upper cervical spine: Unremarkable visualized calvarium,
skullbase, and cervical vertebrae. Pituitary, pineal, cerebellar
tonsils unremarkable. No upper cervical cord lesions.

Sinuses/Orbits: No orbital masses or proptosis. Globes appear
symmetric. Sinuses appear well aerated, without evidence for
air-fluid level.

Other: No nasopharyngeal pathology or mastoid fluid. Scalp and other
visualized extracranial soft tissues grossly unremarkable.
IMPRESSION: Negative exam.

## 2023-07-14 ENCOUNTER — Ambulatory Visit (HOSPITAL_COMMUNITY)
Admission: EM | Admit: 2023-07-14 | Discharge: 2023-07-14 | Disposition: A | Discharge status: Home / Self Care | Payer: No Typology Code available for payment source | Attending: Internal Medicine | Admitting: Internal Medicine

## 2023-07-14 ENCOUNTER — Encounter (HOSPITAL_COMMUNITY): Payer: Self-pay | Admitting: *Deleted

## 2023-07-14 ENCOUNTER — Other Ambulatory Visit: Payer: Self-pay

## 2023-07-14 DIAGNOSIS — T24231A Burn of second degree of right lower leg, initial encounter: Secondary | ICD-10-CM

## 2023-07-14 DIAGNOSIS — L03116 Cellulitis of left lower limb: Secondary | ICD-10-CM

## 2023-07-14 MED ORDER — IBUPROFEN 600 MG PO TABS: 600.0000 mg | ORAL_TABLET | Freq: Four times a day (QID) | ORAL | 0 refills | Status: DC | PRN

## 2023-07-14 MED ORDER — CLINDAMYCIN HCL 300 MG PO CAPS: 300.0000 mg | ORAL_CAPSULE | Freq: Three times a day (TID) | ORAL | 0 refills | Status: DC

## 2023-07-14 MED ORDER — ACETAMINOPHEN 500 MG PO TABS: 1000.0000 mg | ORAL_TABLET | Freq: Four times a day (QID) | ORAL | 0 refills | Status: DC | PRN

## 2023-07-14 MED ORDER — BACITRACIN ZINC 500 UNIT/GM EX OINT: TOPICAL_OINTMENT | Freq: Once | CUTANEOUS | Status: AC
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
## 2023-07-14 NOTE — Discharge Instructions (Signed)
 Your wound is very infected.  Take clindamycin antibiotic every 8 hours for the next 10 days.  Perform warm compresses to the wound and rinse the wound thoroughly every 12 hours (at least), then apply mupirocin ointment to the wound.  After applying mupirocin ointment, apply a Telfa dressing (nonstick gauze) and tape. Allow the wound to breathe and be open to air at nighttime.  Go to the ER if you develop fever, chills, nausea, vomiting, body aches, or new/worsening redness and swelling to the wound.  Please schedule a follow-up appointment with your primary care provider (Dr. Coccaro) listed below.   Schedule a follow-up appointment with [place] [facility], information is below.  Take Tylenol 1000 mg every 6 hours as needed for pain and ibuprofen 600 mg every 6 hours as needed for pain associated with wound.  If you develop any new or worsening symptoms or if your symptoms do not start to improve, please return here or follow-up with your primary care provider. If your symptoms are severe, please go to the emergency room.

## 2023-07-17 ENCOUNTER — Emergency Department (HOSPITAL_COMMUNITY)
Admission: EM | Admit: 2023-07-17 | Discharge: 2023-07-17 | Disposition: A | Discharge status: Home / Self Care | Attending: Emergency Medicine | Admitting: Emergency Medicine

## 2023-07-17 ENCOUNTER — Encounter (HOSPITAL_COMMUNITY): Payer: Self-pay | Admitting: *Deleted

## 2023-07-17 ENCOUNTER — Emergency Department (HOSPITAL_COMMUNITY)

## 2023-07-17 ENCOUNTER — Other Ambulatory Visit: Payer: Self-pay

## 2023-07-17 DIAGNOSIS — U071 COVID-19: Secondary | ICD-10-CM | POA: Diagnosis not present

## 2023-07-17 DIAGNOSIS — R509 Fever, unspecified: Secondary | ICD-10-CM | POA: Diagnosis present

## 2023-07-17 LAB — CBC WITH DIFFERENTIAL/PLATELET
Abs Immature Granulocytes: 0 10*3/uL (ref 0.00–0.07)
Basophils Absolute: 0 10*3/uL (ref 0.0–0.1)
Basophils Relative: 0 %
Eosinophils Absolute: 0 10*3/uL (ref 0.0–0.5)
Eosinophils Relative: 0 %
HCT: 39.4 % (ref 36.0–46.0)
Hemoglobin: 13.1 g/dL (ref 12.0–15.0)
Lymphocytes Relative: 21 %
Lymphs Abs: 1 10*3/uL (ref 0.7–4.0)
MCH: 30.5 pg (ref 26.0–34.0)
MCHC: 33.2 g/dL (ref 30.0–36.0)
MCV: 91.6 fL (ref 80.0–100.0)
Monocytes Absolute: 1 10*3/uL (ref 0.1–1.0)
Monocytes Relative: 20 %
Neutro Abs: 2.9 10*3/uL (ref 1.7–7.7)
Neutrophils Relative %: 59 %
Platelets: UNDETERMINED 10*3/uL (ref 150–400)
RBC: 4.3 MIL/uL (ref 3.87–5.11)
RDW: 12.7 % (ref 11.5–15.5)
WBC: 4.9 10*3/uL (ref 4.0–10.5)
nRBC: 0 % (ref 0.0–0.2)
nRBC: 0 /100{WBCs}

## 2023-07-17 LAB — COMPREHENSIVE METABOLIC PANEL WITH GFR
ALT: 12 U/L (ref 0–44)
AST: 18 U/L (ref 15–41)
Albumin: 3.1 g/dL — ABNORMAL LOW (ref 3.5–5.0)
Alkaline Phosphatase: 52 U/L (ref 38–126)
Anion gap: 8 (ref 5–15)
BUN: 5 mg/dL — ABNORMAL LOW (ref 6–20)
CO2: 23 mmol/L (ref 22–32)
Calcium: 8.8 mg/dL — ABNORMAL LOW (ref 8.9–10.3)
Chloride: 103 mmol/L (ref 98–111)
Creatinine, Ser: 0.89 mg/dL (ref 0.44–1.00)
GFR, Estimated: >60 mL/min (ref >60)
Glucose, Bld: 101 mg/dL — ABNORMAL HIGH (ref 70–99)
Potassium: 3.6 mmol/L (ref 3.5–5.1)
Sodium: 134 mmol/L — ABNORMAL LOW (ref 135–145)
Total Bilirubin: 1.1 mg/dL (ref 0.0–1.2)
Total Protein: 6.8 g/dL (ref 6.5–8.1)

## 2023-07-17 LAB — URINALYSIS, W/ REFLEX TO CULTURE (INFECTION SUSPECTED)
Bilirubin Urine: NEGATIVE
Glucose, UA: NEGATIVE mg/dL
Hgb urine dipstick: NEGATIVE
Ketones, ur: 5 mg/dL — AB
Leukocytes,Ua: NEGATIVE
Nitrite: NEGATIVE
Protein, ur: NEGATIVE mg/dL
Specific Gravity, Urine: 1.011 (ref 1.005–1.030)
pH: 6 (ref 5.0–8.0)

## 2023-07-17 LAB — PROTIME-INR
INR: 1.1 (ref 0.8–1.2)
Prothrombin Time: 14.6 s (ref 11.4–15.2)

## 2023-07-17 LAB — RESP PANEL BY RT-PCR (RSV, FLU A&B, COVID)  RVPGX2
Influenza A by PCR: NEGATIVE
Influenza B by PCR: NEGATIVE
Resp Syncytial Virus by PCR: NEGATIVE
SARS Coronavirus 2 by RT PCR: POSITIVE — AB

## 2023-07-17 LAB — I-STAT CG4 LACTIC ACID, ED: Lactic Acid, Venous: 1.2 mmol/L (ref 0.5–1.9)

## 2023-07-17 LAB — HCG, SERUM, QUALITATIVE: Preg, Serum: NEGATIVE

## 2023-07-17 MED ORDER — LACTATED RINGERS IV BOLUS
1000.0000 mL | Freq: Once | INTRAVENOUS | Status: AC
  Administered 2023-07-17: 1000 mL via INTRAVENOUS

## 2023-07-17 MED ORDER — DEXAMETHASONE SODIUM PHOSPHATE 10 MG/ML IJ SOLN
10.0000 mg | Freq: Once | INTRAMUSCULAR | Status: AC
  Administered 2023-07-17: 10 mg via INTRAVENOUS
  Filled 2023-07-17: qty 1

## 2023-07-17 MED ORDER — IBUPROFEN 400 MG PO TABS
600.0000 mg | ORAL_TABLET | Freq: Once | ORAL | Status: AC
  Administered 2023-07-17: 600 mg via ORAL
  Filled 2023-07-17: qty 1

## 2023-07-17 MED ORDER — ACETAMINOPHEN 500 MG PO TABS
1000.0000 mg | ORAL_TABLET | Freq: Once | ORAL | Status: AC
  Administered 2023-07-17: 1000 mg via ORAL
  Filled 2023-07-17: qty 2

## 2023-07-17 MED ORDER — PROCHLORPERAZINE MALEATE 5 MG PO TABS
5.0000 mg | ORAL_TABLET | Freq: Once | ORAL | Status: AC
  Administered 2023-07-17: 5 mg via ORAL
  Filled 2023-07-17: qty 1

## 2023-07-17 MED ORDER — DIPHENHYDRAMINE HCL 50 MG/ML IJ SOLN
12.5000 mg | Freq: Once | INTRAMUSCULAR | Status: AC
  Administered 2023-07-17: 12.5 mg via INTRAVENOUS
  Filled 2023-07-17: qty 1

## 2023-07-17 NOTE — Discharge Instructions (Addendum)
 You were seen in the ER today for evaluation of your symptoms. You tested positive for COVID. Your blood will be tested for malaria, please follow up with your MyChart. You can continue to rotate Tylenol  1000mg  and ibuprofen  600mg  every 6 hours as needed for symptoms. Please make sure that you are staying well hydrated drinking plenty of fluids, mainly water. Please follow up with your PCP in the next few days for re-evaluation. I have included more information for you to review. If you have any concerns, new or worsening symptoms, please return to the nearest ER for re-evaluation.   Get help if: Your symptoms last for 10 days or longer. Your symptoms get worse over time. You have very bad pain in your face or forehead. Parts of your jaw or neck get very swollen. You have shortness of breath. Get help right away if: You feel pain or pressure in your chest. You have trouble breathing. You faint or feel like you will faint. You keep vomiting and it gets worse. You feel confused. These symptoms may be an emergency. Get help right away. Call your local emergency services (911 in the [place] Do not wait to see if the symptoms will go away. Do not drive yourself to the hospital.
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
## 2023-07-17 NOTE — ED Notes (Signed)
 Patient ambulated to bathroom without difficulty.

## 2023-07-17 NOTE — ED Triage Notes (Addendum)
 She was in [place] 2 weeks ago her home at one time she lives here now
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
## 2023-07-17 NOTE — ED Triage Notes (Signed)
 Recent trip to [place] 2 1/2 weeks ago  the sister mentioned diseases they may have been exposed to

## 2023-07-18 ENCOUNTER — Telehealth (HOSPITAL_COMMUNITY): Payer: Self-pay | Admitting: Emergency Medicine

## 2023-07-18 LAB — TECHNOLOGIST SMEAR REVIEW

## 2023-07-18 NOTE — Telephone Encounter (Signed)
 Received information lab, pt tested positive for Malaria however needed extra blood work (parasite exam - blood work).  RN informed Provider Monique Ano who indicated that pt should return to get blood work completed.  She stated RN could call at 6am which was done, no answer.

## 2023-07-19 ENCOUNTER — Other Ambulatory Visit: Payer: Self-pay

## 2023-07-19 ENCOUNTER — Telehealth (HOSPITAL_COMMUNITY): Payer: Self-pay | Admitting: Emergency Medicine

## 2023-07-19 ENCOUNTER — Emergency Department (HOSPITAL_COMMUNITY)

## 2023-07-19 ENCOUNTER — Inpatient Hospital Stay (HOSPITAL_COMMUNITY)
Admission: EM | Admit: 2023-07-19 | Discharge: 2023-07-22 | DRG: 871 | Disposition: A | Discharge status: Home / Self Care | Attending: Internal Medicine | Admitting: Internal Medicine

## 2023-07-19 ENCOUNTER — Encounter (HOSPITAL_COMMUNITY): Payer: Self-pay

## 2023-07-19 DIAGNOSIS — E66811 Obesity, class 1: Secondary | ICD-10-CM | POA: Diagnosis present

## 2023-07-19 DIAGNOSIS — Z5329 Procedure and treatment not carried out because of patient's decision for other reasons: Secondary | ICD-10-CM | POA: Diagnosis present

## 2023-07-19 DIAGNOSIS — A4189 Other specified sepsis: Secondary | ICD-10-CM | POA: Diagnosis not present

## 2023-07-19 DIAGNOSIS — B509 Plasmodium falciparum malaria, unspecified: Principal | ICD-10-CM | POA: Diagnosis present

## 2023-07-19 DIAGNOSIS — Z1152 Encounter for screening for COVID-19: Secondary | ICD-10-CM

## 2023-07-19 DIAGNOSIS — T24331A Burn of third degree of right lower leg, initial encounter: Secondary | ICD-10-CM | POA: Diagnosis present

## 2023-07-19 DIAGNOSIS — Z8249 Family history of ischemic heart disease and other diseases of the circulatory system: Secondary | ICD-10-CM

## 2023-07-19 DIAGNOSIS — Z6835 Body mass index (BMI) 35.0-35.9, adult: Secondary | ICD-10-CM

## 2023-07-19 DIAGNOSIS — U071 COVID-19: Secondary | ICD-10-CM | POA: Diagnosis present

## 2023-07-19 DIAGNOSIS — E872 Acidosis, unspecified: Secondary | ICD-10-CM | POA: Diagnosis present

## 2023-07-19 DIAGNOSIS — X16XXXA Contact with hot heating appliances, radiators and pipes, initial encounter: Secondary | ICD-10-CM | POA: Diagnosis present

## 2023-07-19 DIAGNOSIS — B54 Unspecified malaria: Secondary | ICD-10-CM | POA: Diagnosis not present

## 2023-07-19 DIAGNOSIS — D61818 Other pancytopenia: Secondary | ICD-10-CM | POA: Diagnosis present

## 2023-07-19 DIAGNOSIS — Z789 Other specified health status: Secondary | ICD-10-CM | POA: Diagnosis present

## 2023-07-19 DIAGNOSIS — Z8616 Personal history of COVID-19: Secondary | ICD-10-CM

## 2023-07-19 DIAGNOSIS — Z792 Long term (current) use of antibiotics: Secondary | ICD-10-CM

## 2023-07-19 LAB — I-STAT CG4 LACTIC ACID, ED: Lactic Acid, Venous: 2.1 mmol/L (ref 0.5–1.9)

## 2023-07-19 LAB — COMPREHENSIVE METABOLIC PANEL WITH GFR
ALT: 23 U/L (ref 0–44)
AST: 36 U/L (ref 15–41)
Albumin: 3 g/dL — ABNORMAL LOW (ref 3.5–5.0)
Alkaline Phosphatase: 71 U/L (ref 38–126)
Anion gap: 11 (ref 5–15)
BUN: 12 mg/dL (ref 6–20)
CO2: 21 mmol/L — ABNORMAL LOW (ref 22–32)
Calcium: 8.7 mg/dL — ABNORMAL LOW (ref 8.9–10.3)
Chloride: 103 mmol/L (ref 98–111)
Creatinine, Ser: 0.87 mg/dL (ref 0.44–1.00)
GFR, Estimated: >60 mL/min (ref >60)
Glucose, Bld: 108 mg/dL — ABNORMAL HIGH (ref 70–99)
Potassium: 3.7 mmol/L (ref 3.5–5.1)
Sodium: 135 mmol/L (ref 135–145)
Total Bilirubin: 3.5 mg/dL — ABNORMAL HIGH (ref 0.0–1.2)
Total Protein: 7.5 g/dL (ref 6.5–8.1)

## 2023-07-19 LAB — PROTIME-INR
INR: 1.2 (ref 0.8–1.2)
Prothrombin Time: 15.4 s — ABNORMAL HIGH (ref 11.4–15.2)

## 2023-07-19 LAB — HCG, SERUM, QUALITATIVE: Preg, Serum: NEGATIVE

## 2023-07-19 LAB — RESP PANEL BY RT-PCR (RSV, FLU A&B, COVID)  RVPGX2
Influenza A by PCR: NEGATIVE
Influenza B by PCR: NEGATIVE
Resp Syncytial Virus by PCR: NEGATIVE
SARS Coronavirus 2 by RT PCR: NEGATIVE

## 2023-07-19 LAB — PATHOLOGIST SMEAR REVIEW

## 2023-07-19 MED ORDER — LACTATED RINGERS IV BOLUS
1000.0000 mL | Freq: Once | INTRAVENOUS | Status: AC
Start: 2023-07-19 — End: 2023-07-20
  Administered 2023-07-19: 1000 mL via INTRAVENOUS

## 2023-07-19 MED ORDER — ONDANSETRON 4 MG PO TBDP
4.0000 mg | ORAL_TABLET | Freq: Once | ORAL | Status: AC
  Administered 2023-07-19: 4 mg via ORAL
  Filled 2023-07-19: qty 1

## 2023-07-19 MED ORDER — ARTESUNATE 110 MG IV SOLR (NON-CDC SUPPLY)
220.0000 mg | Freq: Two times a day (BID) | INTRAVENOUS | Status: DC
  Administered 2023-07-20 (×2): 220 mg via INTRAVENOUS
  Filled 2023-07-19 (×5): qty 22

## 2023-07-19 MED ORDER — ACETAMINOPHEN 325 MG PO TABS
650.0000 mg | ORAL_TABLET | Freq: Once | ORAL | Status: AC
Start: 2023-07-19 — End: 2023-07-19
  Administered 2023-07-19: 650 mg via ORAL
  Filled 2023-07-19: qty 2

## 2023-07-19 NOTE — ED Triage Notes (Signed)
 Patient called to return to ED for positive malaria test requiring additional lab tests. Patient also has covid. Patient reports fever, N/V, and headache. Patient is vomiting in triage but does not appear in distress at this time.

## 2023-07-19 NOTE — ED Notes (Signed)
 Per lab, lavender top needs to be re-drawn and sent down, if tube station is working. If not walk down and let them know it is there. Blood smear slides must be made within 30 minutes to be accurate so please call lab and let them know it is being sent/delivered.

## 2023-07-19 NOTE — Telephone Encounter (Cosign Needed)
 Patient's blood smear positive for Malaria. They were unable to get a hold of her yesterday. I have called the patient, confirmed identity. I have informed her of her results as well as the additional lab work needed to return to the emergency department. The patient verbalizes her understanding and reports that she will come in later today.

## 2023-07-19 NOTE — ED Provider Notes (Signed)
 [facility]Y DEPT [facility] Emergency Department Provider Note MRN:  [id number]  Arrival date & time: 07/20/23     Chief Complaint   Fever and Headache   History of Present Illness   Anne Romero is a 28 y.o. year-old female presents to the ED with chief complaint of malaria.  She states that she received a phone call today telling her that her malaria test was positive and she needed to come back to the hospital for additional testing.  She states that she continues to have fevers, chills, and lightheadedness.  She states that today she began vomiting.  She states that she has associated headache.  She also states that she has COVID.  She denies any shortness of breath or chest pain.Anne Romero  History provided by patient.   Review of Systems  Pertinent positive and negative review of systems noted in HPI.    Physical Exam   Vitals:   07/19/23 2330 07/19/23 2345  BP: 118/74 (!) 133/117  Pulse: 98 87  Resp: (!) 22 (!) 24  Temp:    SpO2: 100% 98%    CONSTITUTIONAL:  non toxic-appearing, NAD NEURO:  Alert and oriented x 3, CN 3-12 grossly intact EYES:  eyes equal and reactive ENT/NECK:  Supple, no stridor  CARDIO:  tachycardic, regular rhythm, appears well-perfused  PULM:  No respiratory distress, wheezing bilaterally GI/GU:  non-distended, no focal tenderness MSK/SPINE:  No gross deformities, no edema, moves all extremities  SKIN:  no rash, atraumatic   *Additional and/or pertinent findings included in MDM below  Diagnostic and Interventional Summary    EKG Interpretation Date/Time:    Ventricular Rate:    PR Interval:    QRS Duration:    QT Interval:    QTC Calculation:   R Axis:      Text Interpretation:         Labs Reviewed  CBC WITH DIFFERENTIAL/PLATELET - Abnormal; Notable for the following components:      Result Value   Platelets 42 (*)    Abs Immature Granulocytes 0.15 (*)    All other components within normal limits  COMPREHENSIVE  METABOLIC PANEL WITH GFR - Abnormal; Notable for the following components:   CO2 21 (*)    Glucose, Bld 108 (*)    Calcium 8.7 (*)    Albumin 3.0 (*)    Total Bilirubin 3.5 (*)    All other components within normal limits  PROTIME-INR - Abnormal; Notable for the following components:   Prothrombin Time 15.4 (*)    All other components within normal limits  MAGNESIUM - Abnormal; Notable for the following components:   Magnesium 1.6 (*)    All other components within normal limits  I-STAT CG4 LACTIC ACID, ED - Abnormal; Notable for the following components:   Lactic Acid, Venous 2.1 (*)    All other components within normal limits  RESP PANEL BY RT-PCR (RSV, FLU A&B, COVID)  RVPGX2  CULTURE, BLOOD (ROUTINE X 2)  CULTURE, BLOOD (ROUTINE X 2)  PARASITE EXAM SCREEN, BLOOD-W CONF TO [facility] (NOT @ [facility])  HCG, SERUM, QUALITATIVE  URINALYSIS, W/ REFLEX TO CULTURE (INFECTION SUSPECTED)  CBC WITH DIFFERENTIAL/PLATELET  COMPREHENSIVE METABOLIC PANEL WITH GFR  MAGNESIUM  I-STAT CG4 LACTIC ACID, ED    DG Chest Port 1 View  Final Result      Medications  artesunate 110 MG IV syringe 220 mg (has no administration in time range)  melatonin tablet 3 mg (has no administration in time range)  ondansetron (  ZOFRAN) injection 4 mg (has no administration in time range)  lactated ringers infusion (has no administration in time range)  acetaminophen  (TYLENOL ) tablet 1,000 mg (has no administration in time range)    Or  acetaminophen  (TYLENOL ) suppository 650 mg (has no administration in time range)  acetaminophen  (TYLENOL ) tablet 650 mg (650 mg Oral Given 07/19/23 1537)  ondansetron (ZOFRAN-ODT) disintegrating tablet 4 mg (4 mg Oral Given 07/19/23 1537)  lactated ringers bolus 1,000 mL (0 mLs Intravenous Stopped 07/20/23 0027)     Procedures  /  Critical Care .Critical Care  Performed by: Sherel Dikes, PA-C Authorized by: Sherel Dikes, PA-C   Critical care provider statement:     Critical care time (minutes):  36   Critical care was necessary to treat or prevent imminent or life-threatening deterioration of the following conditions:  Sepsis   Critical care was time spent personally by me on the following activities:  Development of treatment plan with patient or surrogate, discussions with consultants, evaluation of patient's response to treatment, examination of patient, ordering and review of laboratory studies, ordering and review of radiographic studies, ordering and performing treatments and interventions, pulse oximetry, re-evaluation of patient's condition and review of old charts   ED Course and Medical Decision Making  I have reviewed the triage vital signs, the nursing notes, and pertinent available records from the EMR.  Social Determinants Affecting Complexity of Care: Patient has no clinically significant social determinants affecting this chief complaint..   ED Course:    Medical Decision Making Patient here after recently being diagnosed with malaria.  She was called back to the ED today after positive test results.  Today, she continues to run fevers and is tachycardic.  She feels lightheaded.  She began having vomiting today.  Given that she cannot tolerate oral intake, I do not think that she would do well with oral antimalarial medication.  Feel that she should be admitted.  I started patient on IV artesunate.  Appreciate hospitalist for admitting.  Patient meets SIRS criteria, she is given some fluids.  Suspect this is all secondary to the malaria.  Amount and/or Complexity of Data Reviewed Labs: ordered. Radiology: ordered.  Risk Prescription drug management. Decision regarding hospitalization.         Consultants: I consulted with Hospitalist, Dr. Brock Canner, who is appreciated for admitting.   Treatment and Plan: Patient's exam and diagnostic results are concerning for malaria.  Feel that patient will need admission to the hospital  for further treatment and evaluation.    Final Clinical Impressions(s) / ED Diagnoses     ICD-10-CM   1. Malaria  B54       ED Discharge Orders     None         Discharge Instructions Discussed with and Provided to Patient:   Discharge Instructions   None      Sherel Dikes, PA-C 07/20/23 0115    Earma Gloss, MD 07/20/23 2146

## 2023-07-19 NOTE — ED Provider Triage Note (Signed)
 Emergency Medicine Provider Triage Evaluation Note  Anne Romero , a 28 y.o. female  was evaluated in triage.  Pt complains of positive malaria, needs additional lab work.  Review of Systems  Positive: Nausea, vomiting, fever, chills, body aches Negative: Shortness of breath, chest pain  Physical Exam  BP (!) 143/96 (BP Location: Right Arm)   Pulse (!) 110   Temp (!) 101.6 F (38.7 C)   Resp (!) 26   LMP 07/04/2023 (Approximate)   SpO2 100%  Gen:   Awake, no distress, uncomfortable appearing Resp:  Normal effort  MSK:   Moves extremities without difficulty  Other:  Actively vomiting  Medical Decision Making  Medically screening exam initiated at 3:27 PM.  Appropriate orders placed.  Esmirna Swenor was informed that the remainder of the evaluation will be completed by another provider, this initial triage assessment does not replace that evaluation, and the importance of remaining in the ED until their evaluation is complete.  Labs and medications ordered   Lateia Fraser N, PA-C 07/19/23 1529

## 2023-07-20 ENCOUNTER — Other Ambulatory Visit (HOSPITAL_COMMUNITY): Payer: Self-pay

## 2023-07-20 ENCOUNTER — Telehealth (HOSPITAL_COMMUNITY): Payer: Self-pay | Admitting: Pharmacy Technician

## 2023-07-20 DIAGNOSIS — R112 Nausea with vomiting, unspecified: Secondary | ICD-10-CM

## 2023-07-20 DIAGNOSIS — Z1152 Encounter for screening for COVID-19: Secondary | ICD-10-CM | POA: Diagnosis not present

## 2023-07-20 DIAGNOSIS — E872 Acidosis, unspecified: Secondary | ICD-10-CM

## 2023-07-20 DIAGNOSIS — T24331A Burn of third degree of right lower leg, initial encounter: Secondary | ICD-10-CM | POA: Diagnosis present

## 2023-07-20 DIAGNOSIS — B54 Unspecified malaria: Secondary | ICD-10-CM | POA: Diagnosis present

## 2023-07-20 DIAGNOSIS — D696 Thrombocytopenia, unspecified: Secondary | ICD-10-CM | POA: Diagnosis not present

## 2023-07-20 DIAGNOSIS — R5383 Other fatigue: Secondary | ICD-10-CM | POA: Diagnosis not present

## 2023-07-20 DIAGNOSIS — Z8616 Personal history of COVID-19: Secondary | ICD-10-CM | POA: Diagnosis not present

## 2023-07-20 DIAGNOSIS — E66811 Obesity, class 1: Secondary | ICD-10-CM | POA: Diagnosis present

## 2023-07-20 DIAGNOSIS — A4189 Other specified sepsis: Secondary | ICD-10-CM | POA: Diagnosis present

## 2023-07-20 DIAGNOSIS — D61818 Other pancytopenia: Secondary | ICD-10-CM

## 2023-07-20 DIAGNOSIS — R42 Dizziness and giddiness: Secondary | ICD-10-CM

## 2023-07-20 DIAGNOSIS — Z8249 Family history of ischemic heart disease and other diseases of the circulatory system: Secondary | ICD-10-CM | POA: Diagnosis not present

## 2023-07-20 DIAGNOSIS — U071 COVID-19: Secondary | ICD-10-CM | POA: Diagnosis not present

## 2023-07-20 DIAGNOSIS — Z792 Long term (current) use of antibiotics: Secondary | ICD-10-CM | POA: Diagnosis not present

## 2023-07-20 DIAGNOSIS — Z5329 Procedure and treatment not carried out because of patient's decision for other reasons: Secondary | ICD-10-CM | POA: Diagnosis present

## 2023-07-20 DIAGNOSIS — Z789 Other specified health status: Secondary | ICD-10-CM

## 2023-07-20 DIAGNOSIS — B509 Plasmodium falciparum malaria, unspecified: Secondary | ICD-10-CM | POA: Diagnosis present

## 2023-07-20 DIAGNOSIS — X16XXXA Contact with hot heating appliances, radiators and pipes, initial encounter: Secondary | ICD-10-CM | POA: Diagnosis present

## 2023-07-20 DIAGNOSIS — Z6835 Body mass index (BMI) 35.0-35.9, adult: Secondary | ICD-10-CM | POA: Diagnosis not present

## 2023-07-20 LAB — COMPREHENSIVE METABOLIC PANEL WITH GFR
ALT: 22 U/L (ref 0–44)
AST: 32 U/L (ref 15–41)
Albumin: 2.7 g/dL — ABNORMAL LOW (ref 3.5–5.0)
Alkaline Phosphatase: 61 U/L (ref 38–126)
Anion gap: 15 (ref 5–15)
BUN: 12 mg/dL (ref 6–20)
CO2: 17 mmol/L — ABNORMAL LOW (ref 22–32)
Calcium: 8.3 mg/dL — ABNORMAL LOW (ref 8.9–10.3)
Chloride: 103 mmol/L (ref 98–111)
Creatinine, Ser: 0.71 mg/dL (ref 0.44–1.00)
GFR, Estimated: >60 mL/min (ref >60)
Glucose, Bld: 107 mg/dL — ABNORMAL HIGH (ref 70–99)
Potassium: 3.7 mmol/L (ref 3.5–5.1)
Sodium: 135 mmol/L (ref 135–145)
Total Bilirubin: 3.1 mg/dL — ABNORMAL HIGH (ref 0.0–1.2)
Total Protein: 6.4 g/dL — ABNORMAL LOW (ref 6.5–8.1)

## 2023-07-20 LAB — CBC WITH DIFFERENTIAL/PLATELET
Abs Immature Granulocytes: 0.15 10*3/uL — ABNORMAL HIGH (ref 0.00–0.07)
Abs Immature Granulocytes: 0.13 10*3/uL — ABNORMAL HIGH (ref 0.00–0.07)
Basophils Absolute: 0 10*3/uL (ref 0.0–0.1)
Basophils Absolute: 0.1 10*3/uL (ref 0.0–0.1)
Basophils Relative: 1 %
Basophils Relative: 1 %
Eosinophils Absolute: 0 10*3/uL (ref 0.0–0.5)
Eosinophils Absolute: 0 10*3/uL (ref 0.0–0.5)
Eosinophils Relative: 0 %
Eosinophils Relative: 0 %
HCT: 36.7 % (ref 36.0–46.0)
HCT: 35.6 % — ABNORMAL LOW (ref 36.0–46.0)
Hemoglobin: 12.4 g/dL (ref 12.0–15.0)
Hemoglobin: 11.6 g/dL — ABNORMAL LOW (ref 12.0–15.0)
Immature Granulocytes: 2 %
Immature Granulocytes: 2 %
Lymphocytes Relative: 19 %
Lymphocytes Relative: 20 %
Lymphs Abs: 1.3 10*3/uL (ref 0.7–4.0)
Lymphs Abs: 1.1 10*3/uL (ref 0.7–4.0)
MCH: 30.8 pg (ref 26.0–34.0)
MCH: 30.5 pg (ref 26.0–34.0)
MCHC: 33.8 g/dL (ref 30.0–36.0)
MCHC: 32.6 g/dL (ref 30.0–36.0)
MCV: 91.3 fL (ref 80.0–100.0)
MCV: 93.7 fL (ref 80.0–100.0)
Monocytes Absolute: 0.6 10*3/uL (ref 0.1–1.0)
Monocytes Absolute: 0.5 10*3/uL (ref 0.1–1.0)
Monocytes Relative: 9 %
Monocytes Relative: 9 %
Neutro Abs: 4.7 10*3/uL (ref 1.7–7.7)
Neutro Abs: 3.8 10*3/uL (ref 1.7–7.7)
Neutrophils Relative %: 69 %
Neutrophils Relative %: 68 %
Platelets: 42 10*3/uL — ABNORMAL LOW (ref 150–400)
Platelets: 42 10*3/uL — ABNORMAL LOW (ref 150–400)
RBC: 4.02 MIL/uL (ref 3.87–5.11)
RBC: 3.8 MIL/uL — ABNORMAL LOW (ref 3.87–5.11)
RDW: 13.6 % (ref 11.5–15.5)
RDW: 13.8 % (ref 11.5–15.5)
Smear Review: DECREASED
WBC: 6.8 10*3/uL (ref 4.0–10.5)
WBC: 5.7 10*3/uL (ref 4.0–10.5)
nRBC: 0 % (ref 0.0–0.2)
nRBC: 0 % (ref 0.0–0.2)

## 2023-07-20 LAB — RETICULOCYTES
Immature Retic Fract: 11.2 % (ref 2.3–15.9)
RBC.: 4.04 MIL/uL (ref 3.87–5.11)
Retic Count, Absolute: 60.2 10*3/uL (ref 19.0–186.0)
Retic Ct Pct: 1.5 % (ref 0.4–3.1)

## 2023-07-20 LAB — BILIRUBIN, FRACTIONATED(TOT/DIR/INDIR)
Bilirubin, Direct: 0.7 mg/dL — ABNORMAL HIGH (ref 0.0–0.2)
Indirect Bilirubin: 0.9 mg/dL (ref 0.3–0.9)
Total Bilirubin: 1.6 mg/dL — ABNORMAL HIGH (ref 0.0–1.2)

## 2023-07-20 LAB — I-STAT CG4 LACTIC ACID, ED: Lactic Acid, Venous: 1.8 mmol/L (ref 0.5–1.9)

## 2023-07-20 LAB — PARASITE EXAM SCREEN, BLOOD-W CONF TO LABCORP (NOT @ ARMC)

## 2023-07-20 LAB — LACTATE DEHYDROGENASE: LDH: 702 U/L — ABNORMAL HIGH (ref 98–192)

## 2023-07-20 LAB — TYPE AND SCREEN
ABO/RH(D): B POS
Antibody Screen: NEGATIVE

## 2023-07-20 LAB — MAGNESIUM
Magnesium: 1.6 mg/dL — ABNORMAL LOW (ref 1.7–2.4)
Magnesium: 1.5 mg/dL — ABNORMAL LOW (ref 1.7–2.4)

## 2023-07-20 MED ORDER — LACTATED RINGERS IV SOLN: INTRAVENOUS | Status: DC

## 2023-07-20 MED ORDER — MAGNESIUM SULFATE 2 GM/50ML IV SOLN
2.0000 g | Freq: Once | INTRAVENOUS | Status: AC
  Administered 2023-07-20: 2 g via INTRAVENOUS
  Filled 2023-07-20: qty 50

## 2023-07-20 MED ORDER — SODIUM CHLORIDE 0.9 % IV SOLN
12.5000 mg | Freq: Once | INTRAVENOUS | Status: DC
  Filled 2023-07-20: qty 0.5

## 2023-07-20 MED ORDER — ALBUTEROL SULFATE (2.5 MG/3ML) 0.083% IN NEBU: 2.5000 mg | INHALATION_SOLUTION | Freq: Four times a day (QID) | RESPIRATORY_TRACT | Status: DC | PRN

## 2023-07-20 MED ORDER — ACETAMINOPHEN 325 MG PO TABS: 650.0000 mg | ORAL_TABLET | Freq: Four times a day (QID) | ORAL | Status: DC | PRN

## 2023-07-20 MED ORDER — BUTALBITAL-APAP-CAFFEINE 50-325-40 MG PO TABS
1.0000 | ORAL_TABLET | Freq: Four times a day (QID) | ORAL | Status: DC | PRN
  Administered 2023-07-20 – 2023-07-22 (×7): 1 via ORAL
  Filled 2023-07-20 (×7): qty 1

## 2023-07-20 MED ORDER — ACETAMINOPHEN 650 MG RE SUPP: 650.0000 mg | Freq: Four times a day (QID) | RECTAL | Status: DC | PRN

## 2023-07-20 MED ORDER — ONDANSETRON HCL 4 MG/2ML IJ SOLN
4.0000 mg | Freq: Four times a day (QID) | INTRAMUSCULAR | Status: DC | PRN
  Administered 2023-07-21 – 2023-07-22 (×2): 4 mg via INTRAVENOUS
  Filled 2023-07-20 (×3): qty 2

## 2023-07-20 MED ORDER — SODIUM CHLORIDE 0.9% FLUSH
3.0000 mL | Freq: Two times a day (BID) | INTRAVENOUS | Status: DC
  Administered 2023-07-20 – 2023-07-22 (×5): 3 mL via INTRAVENOUS

## 2023-07-20 MED ORDER — MELATONIN 3 MG PO TABS
3.0000 mg | ORAL_TABLET | Freq: Every evening | ORAL | Status: DC | PRN
  Filled 2023-07-20: qty 1

## 2023-07-20 MED ORDER — ARTEMETHER-LUMEFANTRINE 20-120 MG PO TABS
4.0000 | ORAL_TABLET | Freq: Two times a day (BID) | ORAL | Status: DC
  Administered 2023-07-20 – 2023-07-22 (×5): 4 via ORAL
  Filled 2023-07-20 (×6): qty 4

## 2023-07-20 MED ORDER — ACETAMINOPHEN 500 MG PO TABS
1000.0000 mg | ORAL_TABLET | Freq: Four times a day (QID) | ORAL | Status: DC | PRN
  Administered 2023-07-20: 1000 mg via ORAL
  Filled 2023-07-20 (×2): qty 2

## 2023-07-20 MED ORDER — DIPHENHYDRAMINE HCL 50 MG/ML IJ SOLN
12.5000 mg | Freq: Once | INTRAMUSCULAR | Status: AC
  Administered 2023-07-20: 12.5 mg via INTRAVENOUS
  Filled 2023-07-20: qty 1

## 2023-07-20 NOTE — Progress Notes (Signed)
 Pharmacy: Antimicrobial Stewardship Note  27 YOF with recent travel to [place] and concern for malaria. The patient was originally initiated on artesunate due to an inability to keep orals down.   Artesunate is reserved for cases of severe malaria and though the patient's % parasitemia is unknown - she is not exhibiting other symptoms of severe infection which include: impaired consciousness, seizures, shock, ARDS, pulmonary edema, acute renal failure, disseminated intravascular  coagulation, jaundice, or severe anemia (Hb < 7 g/dl)  Upon visiting the patient today - she is able to keep down orals. Will transition the artesunate to artemether-lumefantrine (Coartem) and complete a 3 day course.   Copay check for 3d course of coartem is $4  Plan - D/c artesunate - Start artemether-lumefantrine (Coartem) 4 tabs twice daily for 3 days  Thank you for allowing pharmacy to be a part of this patient's care.  Garland Junk, PharmD, BCPS, BCIDP Infectious Diseases Clinical Pharmacist 07/20/2023 2:27 PM   **Pharmacist phone directory can now be found on [email or web address] ([email or web address]).  Listed under [facility].

## 2023-07-20 NOTE — Plan of Care (Signed)
   Problem: Education: Goal: Knowledge of risk factors and measures for prevention of condition will improve Outcome: Progressing   Problem: Coping: Goal: Psychosocial and spiritual needs will be supported Outcome: Progressing   Problem: Respiratory: Goal: Will maintain a patent airway Outcome: Progressing Goal: Complications related to the disease process, condition or treatment will be avoided or minimized Outcome: Progressing

## 2023-07-20 NOTE — Progress Notes (Signed)
  Carryover admission to the Day Admitter.  I discussed this case with the EDP, Sherel Dikes, PA.  Per these discussions:   This is a 28 year old female who is being admitted for malaria, recurrent nausea/vomiting, inability to tolerate p.o., mild lactic acidosis, after presenting with positive malaria test.   She had spent approximately 1 month in [place], prefer [place] the night states on 07[place]08/2023.  Since that time, she has developed recurrent subjective fevers associated with chills, along with some loose stool.  On 07/17/2023, she had presented to [facility] ED for further evaluation of the above.  At that time a malaria send off lab was collected and is subsequently returned positive, prompting mom patient to be contacted with instructions to present back to [facility] ED today.   In the interval, the patient continues to experience intermittent objective fevers, chills, loose stool, but now also has recurrent nausea/vomiting and right limited ability to tolerate p.o. at this time.  In the ED today, she has a temperature max of 101.6, with heart rates in the 90s to low 100s; systolic blood pressures in the 1 teens to 140s.   CBC notable for white blood cell count 6800.  Urinalysis is pending.  COVID, influenza, RSV PCR are all negative.  Blood cultures x 2 were collected.  Initial lactic acid was 2.1, with repeat value currently pending.  Chest x-ray shows no evidence of acute cardiopulmonary process.  In terms of treatment for her malaria, as the patient is not currently able to tolerate the oral antiparasitic medication, EDP has started her on the IV option, Artesunate 220 mg iv q12 hours.   I have placed an order for observation to med/tele for further evaluation and management of the above.  I have placed some additional preliminary admit orders via the adult multi-morbid admission order set. I have also ordered lactated Ringer's at 125 cc/h x 12 hours, prn acetaminophen  for fever,  prn IV Zofran.  Have also ordered morning labs in the form of CMP, CBC, magnesium level.    Camelia Cavalier, DO Hospitalist

## 2023-07-20 NOTE — Telephone Encounter (Signed)
 Patient Product/process development scientist completed.    The patient is insured through [employer] [facility] and [employer].     Ran test claim for Coartem 20-120 mg and the current 3 day co-pay is $4.00.   This test claim was processed through [facility]- copay amounts may vary at other pharmacies due to pharmacy/plan contracts, or as the patient moves through the different stages of their insurance plan.     Morgan Arab, CPHT Pharmacy Technician III Certified Patient Advocate [facility] Patient Advocate Team Direct Number: [phone]  Fax: [phone]
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
## 2023-07-20 NOTE — ED Notes (Signed)
 Unable to collect 2nd blood culture at this time due to minute veins /hard stick .
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
## 2023-07-21 ENCOUNTER — Other Ambulatory Visit (HOSPITAL_COMMUNITY): Payer: Self-pay

## 2023-07-21 DIAGNOSIS — B54 Unspecified malaria: Secondary | ICD-10-CM | POA: Diagnosis not present

## 2023-07-21 LAB — COMPREHENSIVE METABOLIC PANEL WITH GFR
ALT: 18 U/L (ref 0–44)
AST: 36 U/L (ref 15–41)
Albumin: 2.3 g/dL — ABNORMAL LOW (ref 3.5–5.0)
Alkaline Phosphatase: 52 U/L (ref 38–126)
Anion gap: 3 — ABNORMAL LOW (ref 5–15)
BUN: <5 mg/dL — ABNORMAL LOW (ref 6–20)
CO2: 27 mmol/L (ref 22–32)
Calcium: 7.8 mg/dL — ABNORMAL LOW (ref 8.9–10.3)
Chloride: 105 mmol/L (ref 98–111)
Creatinine, Ser: 0.75 mg/dL (ref 0.44–1.00)
GFR, Estimated: >60 mL/min (ref >60)
Glucose, Bld: 92 mg/dL (ref 70–99)
Potassium: 3.6 mmol/L (ref 3.5–5.1)
Sodium: 135 mmol/L (ref 135–145)
Total Bilirubin: 0.8 mg/dL (ref 0.0–1.2)
Total Protein: 6.3 g/dL — ABNORMAL LOW (ref 6.5–8.1)

## 2023-07-21 LAB — CBC
HCT: 28.3 % — ABNORMAL LOW (ref 36.0–46.0)
Hemoglobin: 9.6 g/dL — ABNORMAL LOW (ref 12.0–15.0)
MCH: 30.7 pg (ref 26.0–34.0)
MCHC: 33.9 g/dL (ref 30.0–36.0)
MCV: 90.4 fL (ref 80.0–100.0)
Platelets: 80 10*3/uL — ABNORMAL LOW (ref 150–400)
RBC: 3.13 MIL/uL — ABNORMAL LOW (ref 3.87–5.11)
RDW: 14 % (ref 11.5–15.5)
WBC: 4.3 10*3/uL (ref 4.0–10.5)
nRBC: 0 % (ref 0.0–0.2)

## 2023-07-21 LAB — PARASITE EXAM SCREEN, BLOOD-W CONF TO LABCORP (NOT @ ARMC)

## 2023-07-21 LAB — MAGNESIUM: Magnesium: 2.2 mg/dL (ref 1.7–2.4)

## 2023-07-21 MED ORDER — MUPIROCIN 2 % EX OINT
TOPICAL_OINTMENT | Freq: Every day | CUTANEOUS | Status: DC
  Filled 2023-07-21: qty 22

## 2023-07-21 NOTE — Plan of Care (Signed)
   Problem: Education: Goal: Knowledge of risk factors and measures for prevention of condition will improve Outcome: Progressing   Problem: Coping: Goal: Psychosocial and spiritual needs will be supported Outcome: Progressing   Problem: Respiratory: Goal: Will maintain a patent airway Outcome: Progressing Goal: Complications related to the disease process, condition or treatment will be avoided or minimized Outcome: Progressing
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
## 2023-07-22 ENCOUNTER — Other Ambulatory Visit (HOSPITAL_COMMUNITY): Payer: Self-pay

## 2023-07-22 DIAGNOSIS — R519 Headache, unspecified: Secondary | ICD-10-CM

## 2023-07-22 DIAGNOSIS — B54 Unspecified malaria: Secondary | ICD-10-CM | POA: Diagnosis not present

## 2023-07-22 LAB — CULTURE, BLOOD (ROUTINE X 2)
Culture: NO GROWTH
Culture: NO GROWTH

## 2023-07-22 LAB — COMPREHENSIVE METABOLIC PANEL WITH GFR
ALT: 20 U/L (ref 0–44)
AST: 27 U/L (ref 15–41)
Albumin: 2.4 g/dL — ABNORMAL LOW (ref 3.5–5.0)
Alkaline Phosphatase: 49 U/L (ref 38–126)
Anion gap: 7 (ref 5–15)
BUN: <5 mg/dL — ABNORMAL LOW (ref 6–20)
CO2: 24 mmol/L (ref 22–32)
Calcium: 8.4 mg/dL — ABNORMAL LOW (ref 8.9–10.3)
Chloride: 107 mmol/L (ref 98–111)
Creatinine, Ser: 0.72 mg/dL (ref 0.44–1.00)
GFR, Estimated: >60 mL/min (ref >60)
Glucose, Bld: 112 mg/dL — ABNORMAL HIGH (ref 70–99)
Potassium: 3.8 mmol/L (ref 3.5–5.1)
Sodium: 138 mmol/L (ref 135–145)
Total Bilirubin: 0.8 mg/dL (ref 0.0–1.2)
Total Protein: 6.3 g/dL — ABNORMAL LOW (ref 6.5–8.1)

## 2023-07-22 LAB — CBC
HCT: 28.1 % — ABNORMAL LOW (ref 36.0–46.0)
Hemoglobin: 9.3 g/dL — ABNORMAL LOW (ref 12.0–15.0)
MCH: 30.3 pg (ref 26.0–34.0)
MCHC: 33.1 g/dL (ref 30.0–36.0)
MCV: 91.5 fL (ref 80.0–100.0)
Platelets: 157 10*3/uL (ref 150–400)
RBC: 3.07 MIL/uL — ABNORMAL LOW (ref 3.87–5.11)
RDW: 14.3 % (ref 11.5–15.5)
WBC: 5.1 10*3/uL (ref 4.0–10.5)
nRBC: 0 % (ref 0.0–0.2)

## 2023-07-22 LAB — PARASITE EXAM SCREEN, BLOOD-W CONF TO LABCORP (NOT @ ARMC)

## 2023-07-22 LAB — HAPTOGLOBIN: Haptoglobin: <10 mg/dL — ABNORMAL LOW (ref 33–278)

## 2023-07-22 MED ORDER — ARTEMETHER-LUMEFANTRINE 20-120 MG PO TABS
4.0000 | ORAL_TABLET | Freq: Two times a day (BID) | ORAL | 0 refills | Status: AC
  Filled 2023-07-22: qty 4, 1d supply, fill #0

## 2023-07-22 MED ORDER — BUTALBITAL-APAP-CAFFEINE 50-325-40 MG PO TABS
1.0000 | ORAL_TABLET | Freq: Four times a day (QID) | ORAL | 0 refills | Status: DC | PRN
  Filled 2023-07-22: qty 14, 4d supply, fill #0

## 2023-07-22 NOTE — Progress Notes (Addendum)
 I have seen and examined the patient. I have personally reviewed the clinical findings, laboratory findings, microbiological data and imaging studies. The assessment and treatment plan was discussed with the Nurse Practitioner. I agree with her/his recommendations except following additions/corrections.  Afebrile Nausea is better, able to take p.o. Some headache but that is improving as well as fatigue.  Eager to go home  No changes in exam  Parasite screen from today negative.  Thrombocytopenia has resolved  Complete 3 days course of coartem  G6PD ordered in case she needs treatment with primaquine if in case Plasmodium is identified as Vivax or ovale.  Follow-up in the ID clinic has been arranged on 7/3 On airborne and contact precautions  ID will sign off recall back with questions or concerns.  I spent 50 minutes involved in face-to-face and non-face-to-face activities for this patient on the day of the visit. Professional time spent includes the following activities: Preparing to see the patient (review of tests), Obtaining and reviewing separately obtained history (admission/discharge record), Performing a medically appropriate examination and evaluation , Ordering medications/tests, Documenting clinical information in the EMR, Independently interpreting results (not separately reported), communicating with other health care providers, Care coordination (not separately reported).     [facility]  Date of Admission:  07/19/2023      Total days of antibiotics 2             ASSESSMENT: Anne Romero is a 28 y.o. female   Malaria, non-severe -  N/V, Headache, Dizziness, Fatigue -  Thrombocytopenia, Hyperbilirubinemia -  Recent return from 1 month of travel to [place] and [place]. She has received 2 doses of IV Artesunate  and now on last day (2 doses) of Coartem  orally. She is keeping this down. 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  Will repeat parasite smear and FU plasmodium PCR for further identification. No labs drawn today - informed her nurse team that prior to discharge we need to repeat blood work so we can FU appropriately outpatient. Appreciate help from team.  - Finish Coartem  BID for 3 days --> If vomiting occurs within 30 minutes of dose, repeat the dose. - Repeat parasite smear  - FU plasmodium PCR  - ID FU arranged for 7/3    COVID-19 -  I think some of the fatigue and headache may be r/t acute COVID infection. She is not sure this is playing a role but I think she was diagnosed very early in infectious symptoms with negative PCR 3d prior.  Continue airborne/contact isolation      Burn RLE -  Clean and healing well with local wound care.    Headaches -  Discussed that Fiorcet was added. I messaged her nurse about trying a dose given Aleve  is not effective.    PLAN: Fleurette out coartem  as planned to complete 3d course (2 doses left)  FU with ID arranged on 7/3 @ 3:00 pm  ID will sign off - please call back with any questions/concerns or if we can be of further assistance.    Principal Problem:   Malaria Active Problems:   Refusal of blood product   COVID-19 virus infection   Pancytopenia (HCC)   Hypomagnesemia   Hyperbilirubinemia   Lactic acidosis    artemether -lumefantrine   4 tablet Oral BID   mupirocin  ointment   Topical Daily   sodium chloride  flush  3 mL Intravenous Q12H    SUBJECTIVE: Feels like her lungs hurt. No sore  throat.  Headache is improved  Nausea a little but improved with the antiemetic    Review of Systems: Review of Systems  Constitutional:  Positive for malaise/fatigue. Negative for chills and fever.  Respiratory:  Positive for cough.   Gastrointestinal:  Positive for nausea. Negative for abdominal pain, diarrhea and vomiting.  Neurological:  Positive for headaches.    No Known Allergies  OBJECTIVE: Vitals:   07/21/23 0511 07/21/23 0750  07/21/23 1502 07/21/23 2336  BP: (!) 127/115 104/64  104/72  Pulse: 72 73 86 72  Resp: 13  18   Temp: 98 F (36.7 C) 98 F (36.7 C) 98.2 F (36.8 C) 98.2 F (36.8 C)  TempSrc:  Oral Oral Oral  SpO2: 100% 98% 98% 100%  Weight:      Height:       Body mass index is 35.95 kg/m.  Physical Exam Vitals reviewed.  Constitutional:      Appearance: She is well-developed.   Cardiovascular:     Rate and Rhythm: Normal rate.     Heart sounds: No murmur heard. Pulmonary:     Effort: Pulmonary effort is normal. No respiratory distress.     Breath sounds: Normal breath sounds.   Neurological:     Mental Status: She is oriented to person, place, and time.     Lab Results Lab Results  Component Value Date   WBC 4.3 07/21/2023   HGB 9.6 (L) 07/21/2023   HCT 28.3 (L) 07/21/2023   MCV 90.4 07/21/2023   PLT 80 (L) 07/21/2023    Lab Results  Component Value Date   CREATININE 0.75 07/21/2023   BUN <5 (L) 07/21/2023   NA 135 07/21/2023   K 3.6 07/21/2023   CL 105 07/21/2023   CO2 27 07/21/2023    Lab Results  Component Value Date   ALT 18 07/21/2023   AST 36 07/21/2023   ALKPHOS 52 07/21/2023   BILITOT 0.8 07/21/2023     Microbiology: Recent Results (from the past 240 hours)  Culture, blood (Routine x 2)     Status: None   Collection Time: 07/17/23  3:48 PM   Specimen: BLOOD  Result Value Ref Range Status   Specimen Description BLOOD LEFT ANTECUBITAL  Final   Special Requests   Final    AEROBIC BOTTLE ONLY Blood Culture results may not be optimal due to an inadequate volume of blood received in culture bottles   Culture   Final    NO GROWTH 5 DAYS Performed at [facility], [address], [place], [place] [address]    Report Status 07/22/2023 FINAL  Final  Resp panel by RT-PCR (RSV, Flu A&B, Covid) Anterior Nasal Swab     Status: Abnormal   Collection Time: 07/17/23  3:48 PM   Specimen: Anterior Nasal Swab  Result Value Ref Range Status   SARS Coronavirus  2 by RT PCR POSITIVE (A) NEGATIVE Final   Influenza A by PCR NEGATIVE NEGATIVE Final   Influenza B by PCR NEGATIVE NEGATIVE Final    Comment: (NOTE) The Xpert Xpress SARS-CoV-2/FLU/RSV plus assay is intended as an aid in the diagnosis of influenza from Nasopharyngeal swab specimens and should not be used as a sole basis for treatment. Nasal washings and aspirates are unacceptable for Xpert Xpress SARS-CoV-2/FLU/RSV testing.  Fact Sheet for Patients: BloggerCourse.com  Fact Sheet for Healthcare Providers: SeriousBroker.it  This test is not yet approved or cleared by the [place]  FDA and has been authorized for detection  and/or diagnosis of SARS-CoV-2 by FDA under an Emergency Use Authorization (EUA). This EUA will remain in effect (meaning this test can be used) for the duration of the COVID-19 declaration under Section 564(b)(1) of the Act, 21 U.S.C. section 360bbb-3(b)(1), unless the authorization is terminated or revoked.     Resp Syncytial Virus by PCR NEGATIVE NEGATIVE Final    Comment: (NOTE) Fact Sheet for Patients: BloggerCourse.com  Fact Sheet for Healthcare Providers: SeriousBroker.it  This test is not yet approved or cleared by the [place]  FDA and has been authorized for detection and/or diagnosis of SARS-CoV-2 by FDA under an Emergency Use Authorization (EUA). This EUA will remain in effect (meaning this test can be used) for the duration of the COVID-19 declaration under Section 564(b)(1) of the Act, 21 U.S.C. section 360bbb-3(b)(1), unless the authorization is terminated or revoked.  Performed at [facility], [address], [place], [place] [address]   Culture, blood (Routine x 2)     Status: None   Collection Time: 07/17/23  3:53 PM   Specimen: BLOOD RIGHT HAND  Result Value Ref Range Status   Specimen Description BLOOD RIGHT HAND  Final    Special Requests   Final    AEROBIC BOTTLE ONLY Blood Culture results may not be optimal due to an inadequate volume of blood received in culture bottles   Culture   Final    NO GROWTH 5 DAYS Performed at [facility], [address], [place], [place] [address]    Report Status 07/22/2023 FINAL  Final  Resp panel by RT-PCR (RSV, Flu A&B, Covid) Anterior Nasal Swab     Status: None   Collection Time: 07/19/23 10:50 PM   Specimen: Anterior Nasal Swab  Result Value Ref Range Status   SARS Coronavirus 2 by RT PCR NEGATIVE NEGATIVE Final   Influenza A by PCR NEGATIVE NEGATIVE Final   Influenza B by PCR NEGATIVE NEGATIVE Final    Comment: (NOTE) The Xpert Xpress SARS-CoV-2/FLU/RSV plus assay is intended as an aid in the diagnosis of influenza from Nasopharyngeal swab specimens and should not be used as a sole basis for treatment. Nasal washings and aspirates are unacceptable for Xpert Xpress SARS-CoV-2/FLU/RSV testing.  Fact Sheet for Patients: BloggerCourse.com  Fact Sheet for Healthcare Providers: SeriousBroker.it  This test is not yet approved or cleared by the [place]  FDA and has been authorized for detection and/or diagnosis of SARS-CoV-2 by FDA under an Emergency Use Authorization (EUA). This EUA will remain in effect (meaning this test can be used) for the duration of the COVID-19 declaration under Section 564(b)(1) of the Act, 21 U.S.C. section 360bbb-3(b)(1), unless the authorization is terminated or revoked.     Resp Syncytial Virus by PCR NEGATIVE NEGATIVE Final    Comment: (NOTE) Fact Sheet for Patients: BloggerCourse.com  Fact Sheet for Healthcare Providers: SeriousBroker.it  This test is not yet approved or cleared by the [place]  FDA and has been authorized for detection and/or diagnosis of SARS-CoV-2 by FDA under an Emergency Use Authorization  (EUA). This EUA will remain in effect (meaning this test can be used) for the duration of the COVID-19 declaration under Section 564(b)(1) of the Act, 21 U.S.C. section 360bbb-3(b)(1), unless the authorization is terminated or revoked.  Performed at [facility], [address] [place], [place] [address]   Blood Culture (routine x 2)     Status: None (Preliminary result)   Collection Time: 07/19/23 11:04 PM   Specimen: BLOOD  Result Value Ref Range Status   Specimen Description BLOOD RIGHT ANTECUBITAL  Final   Special Requests   Final    BOTTLES DRAWN AEROBIC AND ANAEROBIC Blood Culture adequate volume   Culture   Final    NO GROWTH 3 DAYS Performed at [facility], [address], [place], [place] [address]    Report Status PENDING  Incomplete   Imaging No results found.  Corean Fireman, MSN, NP-C [facility] for Infectious Disease [facility] Pager: [phone]  07/22/2023  9:20 AM   Total Encounter Time: 12 min

## 2023-07-22 NOTE — Progress Notes (Signed)
 Anne Romero to be discharged Home per MD order. Discussed prescriptions and follow up appointments with the patient.  medication list explained in detail and TOC medications given to patient, verbalized understanding.  Skin clean, dry and intact without evidence of skin break down, no evidence of skin tears noted. IV catheter discontinued intact. Site without signs and symptoms of complications. Dressing and pressure applied. Pt denies pain at the site currently. No complaints noted.  Patient free of lines, drains, and wounds.   An After Visit Summary (AVS) was printed and given to the patient. Patient escorted via wheelchair, and discharged home via private auto.  Deberah Falconer, RN
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
## 2023-07-22 NOTE — TOC Transition Note (Signed)
 Transition of Care [facility]) - Discharge Note   Patient Details  Name: Anne Romero MRN: [id number] Date of Birth: 1995/09/25  [facility] CM/SW Contact:  Tom-Johnson, Angelique Ken, RN Phone Number: 07/22/2023, 1:07 PM   Clinical Narrative:     Patient is scheduled for discharge today.  Readmission Risk Assessment done. New patient establishment, hospital f/u and discharge instructions on AVS. Prescriptions sent to [facility] pharmacy and patient will receive meds prior discharge. Sister, Morgane to transport at discharge.  No further TOC needs noted.       Final next level of care: Home/Self Care Barriers to Discharge: Barriers Resolved   Patient Goals and CMS Choice Patient states their goals for this hospitalization and ongoing recovery are:: To return home CMS Medicare.gov Compare Post Acute Care list provided to:: Patient Choice offered to / list presented to : NA      Discharge Placement                Patient to be transferred to facility by: Sister Name of family member notified: Morgane    Discharge Plan and Services Additional resources added to the After Visit Summary for                  DME Arranged: N/A DME Agency: NA       HH Arranged: NA HH Agency: NA        Social Drivers of Health (SDOH) Interventions SDOH Screenings   Food Insecurity: No Food Insecurity (07/20/2023)  Housing: Low Risk  (07/20/2023)  Transportation Needs: No Transportation Needs (07/20/2023)  Utilities: Not At Risk (07/20/2023)  Social Connections: Unknown (06/17/2021)   Received from [facility]  Tobacco Use: Low Risk  (07/19/2023)     Readmission Risk Interventions    07/21/2023   11:40 AM  Readmission Risk Prevention Plan  Post Dischage Appt Complete  Medication Screening Complete  Transportation Screening Complete

## 2023-07-23 ENCOUNTER — Other Ambulatory Visit: Payer: Self-pay

## 2023-07-23 ENCOUNTER — Emergency Department (HOSPITAL_COMMUNITY)
Admission: EM | Admit: 2023-07-23 | Discharge: 2023-07-23 | Disposition: A | Discharge status: Home / Self Care | Attending: Emergency Medicine | Admitting: Emergency Medicine

## 2023-07-23 ENCOUNTER — Encounter (HOSPITAL_COMMUNITY): Payer: Self-pay | Admitting: Emergency Medicine

## 2023-07-23 DIAGNOSIS — M791 Myalgia, unspecified site: Secondary | ICD-10-CM | POA: Diagnosis not present

## 2023-07-23 DIAGNOSIS — R519 Headache, unspecified: Secondary | ICD-10-CM | POA: Insufficient documentation

## 2023-07-23 DIAGNOSIS — R197 Diarrhea, unspecified: Secondary | ICD-10-CM | POA: Diagnosis not present

## 2023-07-23 DIAGNOSIS — B54 Unspecified malaria: Secondary | ICD-10-CM

## 2023-07-23 DIAGNOSIS — R112 Nausea with vomiting, unspecified: Secondary | ICD-10-CM | POA: Diagnosis present

## 2023-07-23 LAB — BASIC METABOLIC PANEL WITH GFR
Anion gap: 11 (ref 5–15)
BUN: <5 mg/dL — ABNORMAL LOW (ref 6–20)
CO2: 23 mmol/L (ref 22–32)
Calcium: 8.4 mg/dL — ABNORMAL LOW (ref 8.9–10.3)
Chloride: 104 mmol/L (ref 98–111)
Creatinine, Ser: 0.67 mg/dL (ref 0.44–1.00)
GFR, Estimated: >60 mL/min (ref >60)
Glucose, Bld: 102 mg/dL — ABNORMAL HIGH (ref 70–99)
Potassium: 3.5 mmol/L (ref 3.5–5.1)
Sodium: 138 mmol/L (ref 135–145)

## 2023-07-23 LAB — CBC
HCT: 28.8 % — ABNORMAL LOW (ref 36.0–46.0)
Hemoglobin: 9.3 g/dL — ABNORMAL LOW (ref 12.0–15.0)
MCH: 29.7 pg (ref 26.0–34.0)
MCHC: 32.3 g/dL (ref 30.0–36.0)
MCV: 92 fL (ref 80.0–100.0)
Platelets: 302 10*3/uL (ref 150–400)
RBC: 3.13 MIL/uL — ABNORMAL LOW (ref 3.87–5.11)
RDW: 14.1 % (ref 11.5–15.5)
WBC: 6.2 10*3/uL (ref 4.0–10.5)
nRBC: 0 % (ref 0.0–0.2)

## 2023-07-23 MED ORDER — ONDANSETRON 4 MG PO TBDP: 4.0000 mg | ORAL_TABLET | Freq: Three times a day (TID) | ORAL | 0 refills | Status: DC | PRN

## 2023-07-23 MED ORDER — LOPERAMIDE HCL 2 MG PO CAPS: 2.0000 mg | ORAL_CAPSULE | Freq: Four times a day (QID) | ORAL | 0 refills | Status: DC | PRN

## 2023-07-23 MED ORDER — ONDANSETRON 4 MG PO TBDP
4.0000 mg | ORAL_TABLET | Freq: Once | ORAL | Status: AC
  Administered 2023-07-23: 4 mg via ORAL
  Filled 2023-07-23: qty 1

## 2023-07-23 MED ORDER — NAPROXEN 500 MG PO TABS: 500.0000 mg | ORAL_TABLET | Freq: Two times a day (BID) | ORAL | 0 refills | Status: DC | PRN

## 2023-07-23 MED ORDER — IBUPROFEN 400 MG PO TABS
600.0000 mg | ORAL_TABLET | Freq: Once | ORAL | Status: AC
  Administered 2023-07-23: 600 mg via ORAL
  Filled 2023-07-23: qty 1

## 2023-07-23 NOTE — ED Triage Notes (Addendum)
 Patient just d/cd after being admitted with Malaria, c/o n/v/d and headache.  Patient is taking meds for headache but states they aren't working.  Patient also requesting being tested for typhoid.  Patient gives verbal consent for MSE.

## 2023-07-23 NOTE — ED Provider Triage Note (Cosign Needed)
 Emergency Medicine Provider Triage Evaluation Note  Anne Romero , a 28 y.o. female  was evaluated in triage.  Pt complains of headache and nausea. Patient was recently discharged from the hospital after completing treatment for malaria, she has experienced headache/nausea/vomiting/diarrhea since discharge. Denies vision changes, abdominal pain, chest pain, shortness of breath  Review of Systems  Positive: As above Negative: As above  Physical Exam  BP 128/79   Pulse 75   Temp 97.9 F (36.6 C) (Oral)   Resp 16   Wt 98 kg   LMP 07/04/2023 (Approximate)   SpO2 100%   BMI 35.95 kg/m  Gen:   Awake, no distress   Resp:  Normal effort  MSK:   Moves extremities without difficulty  Other:    Medical Decision Making  Medically screening exam initiated at 12:08 PM.  Appropriate orders placed.  Anne Romero was informed that the remainder of the evaluation will be completed by another provider, this initial triage assessment does not replace that evaluation, and the importance of remaining in the ED until their evaluation is complete.     Kendrick Pax, [place] 07/23/23 1211

## 2023-07-23 NOTE — Discharge Instructions (Signed)
 You were seen in the emergency department for continued headache nausea vomiting diarrhea in the setting of a recent malaria diagnosis.  Infectious disease feels that your symptoms will take time to improve.  We are prescribing you some medication for nausea and diarrhea.  You can take Naprosyn  for headache.  Follow-up with the ID clinic as scheduled.  Call them or return if any worsening or concerning symptoms

## 2023-07-23 NOTE — ED Provider Notes (Signed)
 [place] EMERGENCY DEPARTMENT AT [facility] Provider Note   CSN: [id number] Arrival date & time: 07/23/23  1128     Patient presents with: Emesis and Diarrhea   Anne Romero is a 28 y.o. female.  She was just discharged yesterday after being admitted for 3 days for malaria.  She had return from 1 month travel to [place] in [place].  Presented to the hospital with nausea vomiting headache fever body aches dry cough.  She said she was given IV and then oral medication.  Seen by infectious disease Dr. Anitra.  She said she left the hospital still having headache nausea vomiting and diarrhea. Taking artemether -lumedantrine.  She did not feel any better today and so presented back to the hospital.  She is also concerned that she may have typhoid and would like to be tested and treated for that.  No significant fever.  Minimal cough.  No blood in her diarrhea and no urinary symptoms.   The history is provided by the patient and the spouse.  Emesis Severity:  Moderate Duration:  4 days Timing:  Intermittent Progression:  Unchanged Associated symptoms: cough and diarrhea   Associated symptoms: no abdominal pain and no fever   Diarrhea Associated symptoms: vomiting   Associated symptoms: no abdominal pain and no fever   Risk factors: travel to endemic area        Prior to Admission medications   Medication Sig Start Date End Date Taking? Authorizing Provider  artemether -lumefantrine  (COARTEM ) 20-120 MG TABS tablet Take 4 tablets by mouth 2 (two) times daily for 1 dose. Take last dose this evening on 07/22/2023 07/22/23 07/23/23  [place], Camellia PARAS, DO  butalbital -acetaminophen -caffeine  (FIORICET ) 50-325-40 MG tablet Take 1 tablet by mouth every 6 (six) hours as needed for headache. 07/22/23   [place], Camellia PARAS, DO  ibuprofen  (ADVIL ) 600 MG tablet Take 1 tablet (600 mg total) by mouth every 6 (six) hours as needed. 07/14/23   Enedelia Dorna HERO, FNP    Allergies:  Patient has no known allergies.    Review of Systems  Constitutional:  Negative for fever.  Respiratory:  Positive for cough.   Gastrointestinal:  Positive for diarrhea and vomiting. Negative for abdominal pain.    Updated Vital Signs BP 113/70 (BP Location: Left Arm)   Pulse 61   Temp 98.3 F (36.8 C)   Resp 18   Wt 98 kg   LMP 07/04/2023 (Approximate)   SpO2 97%   BMI 35.95 kg/m   Physical Exam Vitals and nursing note reviewed.  Constitutional:      General: She is not in acute distress.    Appearance: Normal appearance. She is well-developed.  HENT:     Head: Normocephalic and atraumatic.   Eyes:     Conjunctiva/sclera: Conjunctivae normal.    Cardiovascular:     Rate and Rhythm: Normal rate and regular rhythm.     Heart sounds: No murmur heard. Pulmonary:     Effort: Pulmonary effort is normal. No respiratory distress.     Breath sounds: Normal breath sounds. No stridor. No wheezing.  Abdominal:     Palpations: Abdomen is soft.     Tenderness: There is no abdominal tenderness. There is no guarding or rebound.   Musculoskeletal:        General: No tenderness or deformity. Normal range of motion.     Cervical back: Neck supple.   Skin:    General: Skin is warm and dry.   Neurological:  General: No focal deficit present.     Mental Status: She is alert.     GCS: GCS eye subscore is 4. GCS verbal subscore is 5. GCS motor subscore is 6.     (all labs ordered are listed, but only abnormal results are displayed) Labs Reviewed  CBC - Abnormal; Notable for the following components:      Result Value   RBC 3.13 (*)    Hemoglobin 9.3 (*)    HCT 28.8 (*)    All other components within normal limits  BASIC METABOLIC PANEL WITH GFR - Abnormal; Notable for the following components:   Glucose, Bld 102 (*)    BUN <5 (*)    Calcium 8.4 (*)    All other components within normal limits    EKG: None  Radiology: No results found.   Procedures    Medications Ordered in the ED  ondansetron  (ZOFRAN -ODT) disintegrating tablet 4 mg (4 mg Oral Given 07/23/23 1215)  ibuprofen  (ADVIL ) tablet 600 mg (600 mg Oral Given 07/23/23 1216)    Clinical Course as of 07/24/23 0957  Fri Jul 23, 2023  1754 Discussed with Dr. Dennise infectious disease.  She said that the patient will take a while to improve with appropriate treatment.  She does not feel she needs any further testing at this time unless she is clinically much worse.  As far as typhoid she said she has had blood cultures and there is no indication for changing in treatment right now. [MB]  [id number] I reviewed ID recommendations with patient.  She did not seem completely satisfied but she is willing to give it more time.  Will prescribe her some medication for nausea and diarrhea.  Return instructions discussed. [MB]    Clinical Course User Index [MB] Towana Ozell BROCKS, MD                                 Medical Decision Making Risk Prescription drug management.   This patient complains of headache nausea vomiting diarrhea in the setting of a recent malaria diagnosis; this involves an extensive number of treatment Options and is a complaint that carries with it a high risk of complications and morbidity. The differential includes continued malaria, viral syndrome, infection, dehydration, metabolic derangement  I ordered, reviewed and interpreted labs, which included CBC with normal white count stable low hemoglobin, chemistries unremarkable I ordered medication Tylenol  and Zofran  and reviewed PMP when indicated. Additional history obtained from patient's significant other Previous records obtained and reviewed in epic including discharge summary and recent ID notes I consulted ID Dr. Dennise and discussed lab and imaging findings and discussed disposition.  Cardiac monitoring reviewed, normal sinus rhythm Social determinants considered, no significant barriers Critical Interventions:  None  After the interventions stated above, I reevaluated the patient and found patient to be nontoxic-appearing with clear sensorium Admission and further testing considered, no indications for admission or further workup at this time.  Will provide prescriptions for NSAIDs nausea and diarrhea medication.  Recommend her continue follow-up as scheduled.  Return instructions discussed      Final diagnoses:  Malaria  Nausea vomiting and diarrhea  Generalized headache    ED Discharge Orders          Ordered    ondansetron  (ZOFRAN -ODT) 4 MG disintegrating tablet  Every 8 hours PRN        07/23/23 1835    loperamide  (IMODIUM ) 2  MG capsule  4 times daily PRN        07/23/23 1835    naproxen  (NAPROSYN ) 500 MG tablet  2 times daily PRN        07/23/23 1855               Towana Ozell BROCKS, MD 07/24/23 [phone]

## 2023-07-24 LAB — CULTURE, BLOOD (ROUTINE X 2)
Culture: NO GROWTH
Special Requests: ADEQUATE

## 2023-07-25 LAB — GLUCOSE 6 PHOSPHATE DEHYDROGENASE
G6PDH: 6.1 U/g{Hb} (ref 4.7–14.6)
Hemoglobin: 9.2 g/dL — ABNORMAL LOW (ref 11.1–15.9)

## 2023-07-26 LAB — PARASITE EXAM, BLOOD: Parasite Exam, Blood: POSITIVE — AB

## 2023-07-27 ENCOUNTER — Ambulatory Visit: Payer: Self-pay | Admitting: Infectious Diseases

## 2023-07-27 LAB — PARASITE EXAM, BLOOD

## 2023-08-05 ENCOUNTER — Other Ambulatory Visit: Payer: Self-pay

## 2023-08-05 ENCOUNTER — Ambulatory Visit (INDEPENDENT_AMBULATORY_CARE_PROVIDER_SITE_OTHER): Payer: Self-pay | Admitting: Infectious Diseases

## 2023-08-05 VITALS — BP 111/68 | HR 93 | Temp 98.3°F | Ht 65.0 in | Wt 218.0 lb

## 2023-08-05 DIAGNOSIS — Z79899 Other long term (current) drug therapy: Secondary | ICD-10-CM | POA: Diagnosis not present

## 2023-08-05 DIAGNOSIS — B509 Plasmodium falciparum malaria, unspecified: Secondary | ICD-10-CM

## 2023-08-05 LAB — CBC
HCT: 31.6 % — ABNORMAL LOW (ref 35.0–45.0)
Hemoglobin: 10.3 g/dL — ABNORMAL LOW (ref 11.7–15.5)
MCH: 30.2 pg (ref 27.0–33.0)
MCHC: 32.6 g/dL (ref 32.0–36.0)
MCV: 92.7 fL (ref 80.0–100.0)
MPV: 9.1 fL (ref 7.5–12.5)
Platelets: 419 10*3/uL — ABNORMAL HIGH (ref 140–400)
RBC: 3.41 10*6/uL — ABNORMAL LOW (ref 3.80–5.10)
RDW: 14.4 % (ref 11.0–15.0)
WBC: 3.7 10*3/uL — ABNORMAL LOW (ref 3.8–10.8)

## 2023-08-05 NOTE — Progress Notes (Addendum)
 Patient Active Problem List   Diagnosis Date Noted   Malaria 07/20/2023   Refusal of blood product 07/20/2023   COVID-19 virus infection 07/20/2023   Pancytopenia (HCC) 07/20/2023   Hypomagnesemia 07/20/2023   Hyperbilirubinemia 07/20/2023   Lactic acidosis 07/20/2023    Patient's Medications  New Prescriptions   No medications on file  Previous Medications   BUTALBITAL -ACETAMINOPHEN -CAFFEINE  (FIORICET ) 50-325-40 MG TABLET    Take 1 tablet by mouth every 6 (six) hours as needed for headache.   IBUPROFEN  (ADVIL ) 600 MG TABLET    Take 1 tablet (600 mg total) by mouth every 6 (six) hours as needed.   LOPERAMIDE  (IMODIUM ) 2 MG CAPSULE    Take 1 capsule (2 mg total) by mouth 4 (four) times daily as needed for diarrhea or loose stools.   NAPROXEN  (NAPROSYN ) 500 MG TABLET    Take 1 tablet (500 mg total) by mouth 2 (two) times daily as needed.   ONDANSETRON  (ZOFRAN -ODT) 4 MG DISINTEGRATING TABLET    Take 1 tablet (4 mg total) by mouth every 8 (eight) hours as needed for nausea or vomiting.  Modified Medications   No medications on file  Discontinued Medications   No medications on file    Subjective: Discussed the use of AI scribe software for clinical note transcription with the patient, who gave verbal consent to proceed.   28 Y O Female who is here for HFU after recent hospital admission 6/16-6/19 for non severe malaria. Patient was initially treated with IV artesunate  then transitioned to artemether -lumefantrine  to complete the course for possible P falciparum malaria. Discharged on 6/19.   Seen in the ED 6/20 with continued symptoms and discharged from ED with zofran , loperamide  and naproxen .    7/3 Reports took last dose of coartem  same evening of discharge. All her symptoms have improved. She has noticed some skin peeling in b/l hand and rt foot noted but already improving. No other areas involved or oral ulcers. No complaints otherwise.   Review of Systems: all systems  reviewed with pertinent positives and negatives as listed above  Past Medical History:  Diagnosis Date   Frequent headaches    Past Surgical History:  Procedure Laterality Date   WISDOM TOOTH EXTRACTION      Social History   Tobacco Use   Smoking status: Never   Smokeless tobacco: Never  Vaping Use   Vaping status: Never Used  Substance Use Topics   Alcohol use: Never   Drug use: Never    Family History  Problem Relation Age of Onset   Hypertension Mother     No Known Allergies  Health Maintenance  Topic Date Due   HPV VACCINES (1 - 3-dose series) Never done   HIV Screening  Never done   Hepatitis C Screening  Never done   DTaP/Tdap/Td (1 - Tdap) Never done   Hepatitis B Vaccines (1 of 3 - 19+ 3-dose series) Never done   Cervical Cancer Screening (Pap smear)  Never done   COVID-19 Vaccine (1 - 2024-25 season) Never done   INFLUENZA VACCINE  09/03/2023   Meningococcal B Vaccine  Aged Out   Objective: BP 111/68   Pulse 93   Temp 98.3 F (36.8 C) (Oral)   Ht 5' 5 (1.651 m)   Wt 218 lb (98.9 kg)   LMP 07/04/2023 (Approximate)   SpO2 98%   BMI 36.28 kg/m    Physical Exam Constitutional:      Appearance: Normal appearance.  HENT:  Head: Normocephalic and atraumatic.      Mouth: Mucous membranes are moist.  Eyes:    Conjunctiva/sclera: Conjunctivae normal.     Pupils: Pupils are equal, round, and b/l symmetrical    Cardiovascular:     Rate and Rhythm: Normal rate and regular rhythm.     Heart sounds:   Pulmonary:     Effort: Pulmonary effort is normal.     Breath sounds:   Abdominal:     General: Non distended     Palpations:   Musculoskeletal:        General: Normal range of motion.   Skin:    General: Skin is warm and dry.     Comments:  Neurological:     General: grossly non focal     Mental Status: awake, alert and oriented to person, place, and time.   Psychiatric:        Mood and Affect: Mood normal.   Lab Results Lab  Results  Component Value Date   WBC 6.2 07/23/2023   HGB 9.3 (L) 07/23/2023   HCT 28.8 (L) 07/23/2023   MCV 92.0 07/23/2023   PLT 302 07/23/2023    Lab Results  Component Value Date   CREATININE 0.67 07/23/2023   BUN <5 (L) 07/23/2023   NA 138 07/23/2023   K 3.5 07/23/2023   CL 104 07/23/2023   CO2 23 07/23/2023    Lab Results  Component Value Date   ALT 20 07/22/2023   AST 27 07/22/2023   ALKPHOS 49 07/22/2023   BILITOT 0.8 07/22/2023    No results found for: CHOL, HDL, LDLCALC, LDLDIRECT, TRIG, CHOLHDL No results found for: LABRPR, RPRTITER No results found for: HIV1RNAQUANT, HIV1RNAVL, CD4TABS   Microbiology Results for orders placed or performed during the hospital encounter of 07/19/23  Resp panel by RT-PCR (RSV, Flu A&B, Covid) Anterior Nasal Swab     Status: None   Collection Time: 07/19/23 10:50 PM   Specimen: Anterior Nasal Swab  Result Value Ref Range Status   SARS Coronavirus 2 by RT PCR NEGATIVE NEGATIVE Final   Influenza A by PCR NEGATIVE NEGATIVE Final   Influenza B by PCR NEGATIVE NEGATIVE Final    Comment: (NOTE) The Xpert Xpress SARS-CoV-2/FLU/RSV plus assay is intended as an aid in the diagnosis of influenza from Nasopharyngeal swab specimens and should not be used as a sole basis for treatment. Nasal washings and aspirates are unacceptable for Xpert Xpress SARS-CoV-2/FLU/RSV testing.  Fact Sheet for Patients: BloggerCourse.com  Fact Sheet for Healthcare Providers: SeriousBroker.it  This test is not yet approved or cleared by the [place]  FDA and has been authorized for detection and/or diagnosis of SARS-CoV-2 by FDA under an Emergency Use Authorization (EUA). This EUA will remain in effect (meaning this test can be used) for the duration of the COVID-19 declaration under Section 564(b)(1) of the Act, 21 U.S.C. section 360bbb-3(b)(1), unless the authorization is  terminated or revoked.     Resp Syncytial Virus by PCR NEGATIVE NEGATIVE Final    Comment: (NOTE) Fact Sheet for Patients: BloggerCourse.com  Fact Sheet for Healthcare Providers: SeriousBroker.it  This test is not yet approved or cleared by the [place]  FDA and has been authorized for detection and/or diagnosis of SARS-CoV-2 by FDA under an Emergency Use Authorization (EUA). This EUA will remain in effect (meaning this test can be used) for the duration of the COVID-19 declaration under Section 564(b)(1) of the Act, 21 U.S.C. section 360bbb-3(b)(1), unless the authorization is terminated or  revoked.  Performed at [facility], [address] [place], [place] [address]   Blood Culture (routine x 2)     Status: None   Collection Time: 07/19/23 11:04 PM   Specimen: BLOOD  Result Value Ref Range Status   Specimen Description BLOOD RIGHT ANTECUBITAL  Final   Special Requests   Final    BOTTLES DRAWN AEROBIC AND ANAEROBIC Blood Culture adequate volume   Culture   Final    NO GROWTH 5 DAYS Performed at [facility], 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 Professional time spent includes the following activities: Preparing to see the patient (review of tests), Obtaining and reviewing separately obtained history (discharge record 6/19 and ED notes 6/20), Documenting clinical information in the EMR, Independently interpreting results (not separately reported), Communicating results to the patient, Counseling  and educating the patient and Care coordination (not separately reported).   Of note, portions of this note may have been created with voice recognition software. While this note has been edited for accuracy, occasional wrong-word or 'sound-a-like' substitutions may have occurred due to the inherent limitations of voice recognition software.   Annalee Joseph, MD [facility] 08/05/2023, 2:55 PM

## 2023-08-11 ENCOUNTER — Ambulatory Visit: Admitting: Physician Assistant

## 2023-09-02 ENCOUNTER — Ambulatory Visit: Admitting: Family Medicine

## 2023-09-02 ENCOUNTER — Encounter: Payer: Self-pay | Admitting: Family Medicine

## 2023-09-02 VITALS — BP 114/76 | HR 80 | Temp 98.1°F | Ht 66.5 in | Wt 226.0 lb

## 2023-09-02 DIAGNOSIS — Z124 Encounter for screening for malignant neoplasm of cervix: Secondary | ICD-10-CM

## 2023-09-02 DIAGNOSIS — L309 Dermatitis, unspecified: Secondary | ICD-10-CM

## 2023-09-02 DIAGNOSIS — D649 Anemia, unspecified: Secondary | ICD-10-CM | POA: Diagnosis not present

## 2023-09-02 DIAGNOSIS — Z7689 Persons encountering health services in other specified circumstances: Secondary | ICD-10-CM

## 2023-09-02 DIAGNOSIS — E66812 Obesity, class 2: Secondary | ICD-10-CM | POA: Diagnosis not present

## 2023-09-02 DIAGNOSIS — E6609 Other obesity due to excess calories: Secondary | ICD-10-CM

## 2023-09-02 DIAGNOSIS — R790 Abnormal level of blood mineral: Secondary | ICD-10-CM

## 2023-09-02 DIAGNOSIS — Z6835 Body mass index (BMI) 35.0-35.9, adult: Secondary | ICD-10-CM

## 2023-09-02 MED ORDER — TRIAMCINOLONE ACETONIDE 0.1 % EX CREA: 1.0000 | TOPICAL_CREAM | Freq: Two times a day (BID) | CUTANEOUS | 0 refills | Status: AC

## 2023-09-02 NOTE — Addendum Note (Signed)
 Addended by: Ashantee Deupree R on: 09/02/2023 12:46 PM   Modules accepted: Orders

## 2023-09-02 NOTE — Progress Notes (Signed)
 New Patient Office Visit  Subjective   Patient ID: Anne Romero, female    DOB: March 21, 1995  Age: 28 y.o. MRN: [id number]  CC:  Chief Complaint  Patient presents with   New Patient (Initial Visit)    HPI Anne Romero presents to establish care with new provider.  Patients previous primary care provider: Possibly [facility], longer than 3 year ago.   Specialist: None   Patient is complaining of her neck itching. Started a couple of weeks ago. Occurs 1-3 times a day. Denies rash. She has been doing anything, except using some oil that has not been helping.   Outpatient Encounter Medications as of 09/02/2023  Medication Sig   triamcinolone  cream (KENALOG ) 0.1 % Apply 1 Application topically 2 (two) times daily for 15 days.   [DISCONTINUED] butalbital -acetaminophen -caffeine  (FIORICET ) 50-325-40 MG tablet Take 1 tablet by mouth every 6 (six) hours as needed for headache.   [DISCONTINUED] ibuprofen  (ADVIL ) 600 MG tablet Take 1 tablet (600 mg total) by mouth every 6 (six) hours as needed.   [DISCONTINUED] loperamide  (IMODIUM ) 2 MG capsule Take 1 capsule (2 mg total) by mouth 4 (four) times daily as needed for diarrhea or loose stools.   [DISCONTINUED] naproxen  (NAPROSYN ) 500 MG tablet Take 1 tablet (500 mg total) by mouth 2 (two) times daily as needed.   [DISCONTINUED] ondansetron  (ZOFRAN -ODT) 4 MG disintegrating tablet Take 1 tablet (4 mg total) by mouth every 8 (eight) hours as needed for nausea or vomiting.   No facility-administered encounter medications on file as of 09/02/2023.    Past Medical History:  Diagnosis Date   Frequent headaches    Malaria     Past Surgical History:  Procedure Laterality Date   WISDOM TOOTH EXTRACTION      Family History  Problem Relation Age of Onset   Hypertension Mother     Social History   Socioeconomic History   Marital status: Single    Spouse name: Not on file   Number of children: 0   Years of  education: Not on file   Highest education level: Bachelor's degree (e.g., BA, AB, BS)  Occupational History    Employer: [employer]  Tobacco Use   Smoking status: Never   Smokeless tobacco: Never  Vaping Use   Vaping status: Never Used  Substance and Sexual Activity   Alcohol use: Never   Drug use: Never   Sexual activity: Not Currently    Birth control/protection: None  Other Topics Concern   Not on file  Social History Narrative   Patient is right-handed. She lives with her parents in a one level home. She avoids caffeine . She does not exercise.   Social Drivers of Corporate investment banker Strain: Not on file  Food Insecurity: No Food Insecurity (07/20/2023)   Hunger Vital Sign    Worried About Running Out of Food in the Last Year: Never true    Ran Out of Food in the Last Year: Never true  Transportation Needs: No Transportation Needs (07/20/2023)   PRAPARE - Administrator, Civil Service (Medical): No    Lack of Transportation (Non-Medical): No  Physical Activity: Not on file  Stress: Not on file  Social Connections: Unknown (06/17/2021)   Received from [facility]   Social Network    Social Network: Not on file  Intimate Partner Violence: Not At Risk (07/20/2023)   Humiliation, Afraid, Rape, and Kick questionnaire    Fear of Current or Ex-Partner:  No    Emotionally Abused: No    Physically Abused: No    Sexually Abused: No    ROS See HPI above    Objective  BP 114/76 (BP Location: Left Arm, Patient Position: Sitting, Cuff Size: Large)   Pulse 80   Temp 98.1 F (36.7 C) (Oral)   Ht 5' 6.5 (1.689 m)   Wt 226 lb (102.5 kg)   LMP 08/13/2023 (Approximate)   SpO2 99%   BMI 35.93 kg/m   Physical Exam Vitals reviewed.  Constitutional:      General: She is not in acute distress.    Appearance: Normal appearance. She is obese. She is not ill-appearing, toxic-appearing or diaphoretic.  HENT:     Head: Normocephalic and atraumatic.  Eyes:      General:        Right eye: No discharge.        Left eye: No discharge.     Conjunctiva/sclera: Conjunctivae normal.  Cardiovascular:     Rate and Rhythm: Normal rate and regular rhythm.     Heart sounds: Normal heart sounds. No murmur heard.    No friction rub. No gallop.  Pulmonary:     Effort: Pulmonary effort is normal. No respiratory distress.     Breath sounds: Normal breath sounds.  Musculoskeletal:        General: Normal range of motion.  Skin:    General: Skin is warm and dry.     Findings: Rash (See picture included for rash on right side of neck) present.  Neurological:     General: No focal deficit present.     Mental Status: She is alert and oriented to person, place, and time. Mental status is at baseline.  Psychiatric:        Mood and Affect: Mood normal.        Behavior: Behavior normal.        Thought Content: Thought content normal.        Judgment: Judgment normal.       Assessment & Plan:  Dermatitis -     Triamcinolone  Acetonide; Apply 1 Application topically 2 (two) times daily for 15 days.  Dispense: 30 g; Refill: 0  Class 2 obesity due to excess calories without serious comorbidity with body mass index (BMI) of 35.0 to 35.9 in adult -     CBC with Differential/Platelet -     Comprehensive metabolic panel with GFR -     Lipid panel -     TSH -     Hemoglobin A1c  Anemia, unspecified type -     CBC with Differential/Platelet -     Iron, TIBC and Ferritin Panel  Low magnesium  level -     Magnesium   Cervical cancer screening -     Ambulatory referral to Gynecology  Encounter to establish care   1.Review health maintenance:  -Cervical cancer screening: Referral to GYN  -Covid booster: Declines  -Tdap: Upload immunization records  -Hep B vaccines: Upload immunization records  -HIV and Hep C screening: Ordered  -HPV vaccine: Unsure  2.Ordered labs (CBC, iron panel, CMP, Lipid panel-fasting, A1c, TSH, and magnesium ) due to BMI-obesity,  history of anemia, and low magnesium  level. 3.Prescribed Triamcinolone  cream for rash on right side of neck. Recommend to cleanse skin with a mild soap twice a day, such as Dial Antibacterial Soap, pat dry, and apply a thin layer of Triamcinolone  cream to the area. You may also mix a 1:1 mixture of moisturizing cream  to the area.  Return in about 1 year (around 09/01/2024) for physical.   Kymber Kosar, NP

## 2023-09-02 NOTE — Patient Instructions (Addendum)
-  It was nice to meet you today and look forward to taking care of you today.  -Ordered labs. Office will call with lab results and will be available on MyChart.  -Please upload immunization records to MyChart.  -Prescribed Triamcinolone  cream for rash on right side of neck. Recommend to cleanse skin with a mild soap twice a day, such as Dial Antibacterial Soap, pat dry, and apply a thin layer of Triamcinolone  cream to the area. You may also mix a 1:1 mixture of moisturizing cream to the area. -Follow up in 1 year for a physical.

## 2023-09-06 ENCOUNTER — Other Ambulatory Visit (INDEPENDENT_AMBULATORY_CARE_PROVIDER_SITE_OTHER)

## 2023-09-06 DIAGNOSIS — R790 Abnormal level of blood mineral: Secondary | ICD-10-CM

## 2023-09-06 DIAGNOSIS — Z6835 Body mass index (BMI) 35.0-35.9, adult: Secondary | ICD-10-CM

## 2023-09-06 DIAGNOSIS — E6609 Other obesity due to excess calories: Secondary | ICD-10-CM | POA: Diagnosis not present

## 2023-09-06 DIAGNOSIS — D649 Anemia, unspecified: Secondary | ICD-10-CM

## 2023-09-06 DIAGNOSIS — E66812 Obesity, class 2: Secondary | ICD-10-CM | POA: Diagnosis not present

## 2023-09-06 LAB — CBC WITH DIFFERENTIAL/PLATELET
Basophils Absolute: 0 K/uL (ref 0.0–0.1)
Basophils Relative: 0.6 % (ref 0.0–3.0)
Eosinophils Absolute: 0.1 K/uL (ref 0.0–0.7)
Eosinophils Relative: 2.3 % (ref 0.0–5.0)
HCT: 36.6 % (ref 36.0–46.0)
Hemoglobin: 12.5 g/dL (ref 12.0–15.0)
Lymphocytes Relative: 39.9 % (ref 12.0–46.0)
Lymphs Abs: 1.5 K/uL (ref 0.7–4.0)
MCHC: 34.1 g/dL (ref 30.0–36.0)
MCV: 92.6 fl (ref 78.0–100.0)
Monocytes Absolute: 0.3 K/uL (ref 0.1–1.0)
Monocytes Relative: 8.8 % (ref 3.0–12.0)
Neutro Abs: 1.8 K/uL (ref 1.4–7.7)
Neutrophils Relative %: 48.4 % (ref 43.0–77.0)
Platelets: 332 K/uL (ref 150.0–400.0)
RBC: 3.95 Mil/uL (ref 3.87–5.11)
RDW: 14.5 % (ref 11.5–15.5)
WBC: 3.7 K/uL — ABNORMAL LOW (ref 4.0–10.5)

## 2023-09-06 LAB — COMPREHENSIVE METABOLIC PANEL WITH GFR
ALT: 7 U/L (ref 0–35)
AST: 10 U/L (ref 0–37)
Albumin: 4 g/dL (ref 3.5–5.2)
Alkaline Phosphatase: 44 U/L (ref 39–117)
BUN: 10 mg/dL (ref 6–23)
CO2: 25 meq/L (ref 19–32)
Calcium: 8.9 mg/dL (ref 8.4–10.5)
Chloride: 106 meq/L (ref 96–112)
Creatinine, Ser: 0.67 mg/dL (ref 0.40–1.20)
GFR: 121.21 mL/min (ref >60.00)
Glucose, Bld: 90 mg/dL (ref 70–99)
Potassium: 3.6 meq/L (ref 3.5–5.1)
Sodium: 139 meq/L (ref 135–145)
Total Bilirubin: 0.4 mg/dL (ref 0.2–1.2)
Total Protein: 7.1 g/dL (ref 6.0–8.3)

## 2023-09-06 LAB — LIPID PANEL
Cholesterol: 203 mg/dL — ABNORMAL HIGH (ref 0–200)
HDL: 61 mg/dL (ref >39.00)
LDL Cholesterol: 126 mg/dL — ABNORMAL HIGH (ref 0–99)
NonHDL: 142.36
Total CHOL/HDL Ratio: 3
Triglycerides: 80 mg/dL (ref 0.0–149.0)
VLDL: 16 mg/dL (ref 0.0–40.0)

## 2023-09-06 LAB — TSH: TSH: 3.04 u[IU]/mL (ref 0.35–5.50)

## 2023-09-06 LAB — MAGNESIUM: Magnesium: 1.9 mg/dL (ref 1.5–2.5)

## 2023-09-06 LAB — HEMOGLOBIN A1C: Hgb A1c MFr Bld: 4.9 % (ref 4.6–6.5)

## 2023-09-07 ENCOUNTER — Ambulatory Visit: Payer: Self-pay | Admitting: Family Medicine

## 2023-09-07 LAB — IRON,TIBC AND FERRITIN PANEL
%SAT: 34 % (ref 16–45)
Ferritin: 17 ng/mL (ref 16–154)
Iron: 96 ug/dL (ref 40–190)
TIBC: 284 ug/dL (ref 250–450)
